# Patient Record
Sex: Male | Born: 1950 | Race: Black or African American | Hispanic: No | Marital: Married | Smoking: Former smoker
Health system: Southern US, Community
[De-identification: ages and names within clinical notes are randomized; demographics above are authoritative.]

## PROBLEM LIST (undated history)

## (undated) DIAGNOSIS — I2510 Atherosclerotic heart disease of native coronary artery without angina pectoris: Secondary | ICD-10-CM

## (undated) DIAGNOSIS — E7800 Pure hypercholesterolemia, unspecified: Secondary | ICD-10-CM

## (undated) DIAGNOSIS — I779 Disorder of arteries and arterioles, unspecified: Secondary | ICD-10-CM

## (undated) DIAGNOSIS — E039 Hypothyroidism, unspecified: Secondary | ICD-10-CM

## (undated) DIAGNOSIS — I10 Essential (primary) hypertension: Secondary | ICD-10-CM

## (undated) DIAGNOSIS — N289 Disorder of kidney and ureter, unspecified: Secondary | ICD-10-CM

## (undated) DIAGNOSIS — R7303 Prediabetes: Secondary | ICD-10-CM

## (undated) DIAGNOSIS — G4730 Sleep apnea, unspecified: Secondary | ICD-10-CM

## (undated) DIAGNOSIS — I499 Cardiac arrhythmia, unspecified: Secondary | ICD-10-CM

## (undated) HISTORY — PX: KNEE SURGERY: SHX244

## (undated) HISTORY — PX: CORONARY ARTERY BYPASS GRAFT: SHX141

## (undated) HISTORY — PX: CARDIAC CATHETERIZATION: SHX172

---

## 1999-04-07 ENCOUNTER — Emergency Department (HOSPITAL_COMMUNITY): Admission: EM | Admit: 1999-04-07 | Discharge: 1999-04-07 | Payer: Self-pay | Admitting: Emergency Medicine

## 1999-09-28 ENCOUNTER — Emergency Department (HOSPITAL_COMMUNITY): Admission: EM | Admit: 1999-09-28 | Discharge: 1999-09-28 | Payer: Self-pay | Admitting: Emergency Medicine

## 2003-12-21 ENCOUNTER — Emergency Department (HOSPITAL_COMMUNITY): Admission: EM | Admit: 2003-12-21 | Discharge: 2003-12-21 | Payer: Self-pay | Admitting: Emergency Medicine

## 2005-07-07 ENCOUNTER — Emergency Department (HOSPITAL_COMMUNITY): Admission: EM | Admit: 2005-07-07 | Discharge: 2005-07-07 | Payer: Self-pay | Admitting: Emergency Medicine

## 2012-05-20 ENCOUNTER — Inpatient Hospital Stay (HOSPITAL_BASED_OUTPATIENT_CLINIC_OR_DEPARTMENT_OTHER)
Admission: EM | Admit: 2012-05-20 | Discharge: 2012-05-21 | DRG: 134 | Disposition: A | Discharge status: Home / Self Care | Payer: Federal, State, Local not specified - PPO | Attending: Internal Medicine | Admitting: Internal Medicine

## 2012-05-20 ENCOUNTER — Emergency Department (HOSPITAL_BASED_OUTPATIENT_CLINIC_OR_DEPARTMENT_OTHER): Payer: Federal, State, Local not specified - PPO

## 2012-05-20 ENCOUNTER — Encounter (HOSPITAL_BASED_OUTPATIENT_CLINIC_OR_DEPARTMENT_OTHER): Payer: Self-pay

## 2012-05-20 DIAGNOSIS — I10 Essential (primary) hypertension: Principal | ICD-10-CM | POA: Diagnosis present

## 2012-05-20 DIAGNOSIS — Z7982 Long term (current) use of aspirin: Secondary | ICD-10-CM

## 2012-05-20 DIAGNOSIS — E785 Hyperlipidemia, unspecified: Secondary | ICD-10-CM | POA: Diagnosis present

## 2012-05-20 DIAGNOSIS — Z79899 Other long term (current) drug therapy: Secondary | ICD-10-CM

## 2012-05-20 DIAGNOSIS — R0789 Other chest pain: Secondary | ICD-10-CM | POA: Diagnosis present

## 2012-05-20 DIAGNOSIS — I200 Unstable angina: Secondary | ICD-10-CM

## 2012-05-20 DIAGNOSIS — R079 Chest pain, unspecified: Secondary | ICD-10-CM | POA: Diagnosis present

## 2012-05-20 DIAGNOSIS — F4310 Post-traumatic stress disorder, unspecified: Secondary | ICD-10-CM | POA: Diagnosis present

## 2012-05-20 DIAGNOSIS — Z8249 Family history of ischemic heart disease and other diseases of the circulatory system: Secondary | ICD-10-CM

## 2012-05-20 DIAGNOSIS — Z87891 Personal history of nicotine dependence: Secondary | ICD-10-CM

## 2012-05-20 HISTORY — DX: Pure hypercholesterolemia, unspecified: E78.00

## 2012-05-20 HISTORY — DX: Essential (primary) hypertension: I10

## 2012-05-20 LAB — COMPREHENSIVE METABOLIC PANEL
ALT: 18 U/L (ref 0–53)
AST: 26 U/L (ref 0–37)
Albumin: 4.5 g/dL (ref 3.5–5.2)
Alkaline Phosphatase: 99 U/L (ref 39–117)
BUN: 10 mg/dL (ref 6–23)
CO2: 28 mEq/L (ref 19–32)
Calcium: 10.3 mg/dL (ref 8.4–10.5)
Chloride: 102 mEq/L (ref 96–112)
Creatinine, Ser: 1 mg/dL (ref 0.50–1.35)
GFR calc Af Amer: >90 mL/min (ref >90)
GFR calc non Af Amer: 79 mL/min — ABNORMAL LOW (ref >90)
Glucose, Bld: 94 mg/dL (ref 70–99)
Potassium: 4.1 mEq/L (ref 3.5–5.1)
Sodium: 141 mEq/L (ref 135–145)
Total Bilirubin: 0.4 mg/dL (ref 0.3–1.2)
Total Protein: 7.8 g/dL (ref 6.0–8.3)

## 2012-05-20 LAB — CBC
HCT: 42.2 % (ref 39.0–52.0)
Hemoglobin: 14.6 g/dL (ref 13.0–17.0)
MCH: 32.7 pg (ref 26.0–34.0)
MCHC: 34.6 g/dL (ref 30.0–36.0)
MCV: 94.6 fL (ref 78.0–100.0)
Platelets: 185 10*3/uL (ref 150–400)
RBC: 4.46 MIL/uL (ref 4.22–5.81)
RDW: 12 % (ref 11.5–15.5)
WBC: 5.2 10*3/uL (ref 4.0–10.5)

## 2012-05-20 LAB — TROPONIN I: Troponin I: <0.3 ng/mL (ref <0.30)

## 2012-05-20 MED ORDER — ASPIRIN 81 MG PO CHEW
CHEWABLE_TABLET | ORAL | Status: AC
  Filled 2012-05-20: qty 4

## 2012-05-20 MED ORDER — NITROGLYCERIN IN D5W 200-5 MCG/ML-% IV SOLN
2.0000 ug/min | Freq: Once | INTRAVENOUS | Status: AC
  Administered 2012-05-20: 5 ug/min via INTRAVENOUS

## 2012-05-20 MED ORDER — NITROGLYCERIN 0.4 MG SL SUBL
0.4000 mg | SUBLINGUAL_TABLET | SUBLINGUAL | Status: AC | PRN
  Administered 2012-05-20 (×3): 0.4 mg via SUBLINGUAL
  Filled 2012-05-20: qty 25

## 2012-05-20 MED ORDER — ENOXAPARIN SODIUM 100 MG/ML ~~LOC~~ SOLN
1.0000 mg/kg | Freq: Once | SUBCUTANEOUS | Status: AC
  Administered 2012-05-20: 85 mg via SUBCUTANEOUS
  Filled 2012-05-20: qty 1

## 2012-05-20 MED ORDER — ASPIRIN 81 MG PO CHEW
324.0000 mg | CHEWABLE_TABLET | Freq: Once | ORAL | Status: AC
  Administered 2012-05-20: 324 mg via ORAL

## 2012-05-20 MED ORDER — NITROGLYCERIN IN D5W 200-5 MCG/ML-% IV SOLN
INTRAVENOUS | Status: AC
  Filled 2012-05-20: qty 250

## 2012-05-20 NOTE — ED Provider Notes (Signed)
History     CSN: [id number]  Arrival date & time 05/20/12  2154   First MD Initiated Contact with Patient 05/20/12 2219      Chief Complaint  Patient presents with  . Hypertension  . Chest Pain    (Consider location/radiation/quality/duration/timing/severity/associated sxs/prior treatment) HPI Complains of left-sided anterior chest pain nonradiating described as a "heaviness "onset 6:45 PM today accompanied by palpitations. Other associated symptoms include nausea and sweatiness and mild shortness of breath. Pain is presently mild and nonradiating. Not made better or worse by anything. No treatment prior to coming here. Patient is to noncompliance with his medications recently Past Medical History  Diagnosis Date  . Hypertension   . High cholesterol     Past Surgical History  Procedure Date  . Knee surgery     No family history on file.  History  Substance Use Topics  . Smoking status: Former Games developer  . Smokeless tobacco: Not on file  . Alcohol Use: Yes      Review of Systems  Constitutional: Negative.   HENT: Negative.   Respiratory: Positive for shortness of breath.   Cardiovascular: Positive for chest pain and palpitations.       Sweatiness  Gastrointestinal: Positive for nausea.  Musculoskeletal: Negative.   Skin: Negative.   Neurological: Negative.   Hematological: Negative.   Psychiatric/Behavioral: Negative.   All other systems reviewed and are negative.    Allergies  Review of patient's allergies indicates no known allergies.  Home Medications   Current Outpatient Rx  Name Route Sig Dispense Refill  . CHLORTHALIDONE 25 MG PO TABS Oral Take 25 mg by mouth daily.    Marland Kitchen FLUOXETINE HCL 20 MG PO CAPS Oral Take 20 mg by mouth daily.    Marland Kitchen PROPRANOLOL HCL 80 MG PO TABS Oral Take 80 mg by mouth 3 (three) times daily.      BP 217/105  Pulse 61  Temp(Src) 98.3 F (36.8 C) (Oral)  Resp 18  Ht 5\' 10"  (1.778 m)  Wt 189 lb (85.73 kg)  BMI 27.12  kg/m2  Physical Exam  Nursing note and vitals reviewed. Constitutional: He appears well-developed and well-nourished.  HENT:  Head: Normocephalic and atraumatic.  Eyes: Conjunctivae are normal. Pupils are equal, round, and reactive to light.  Neck: Neck supple. No tracheal deviation present. No thyromegaly present.  Cardiovascular: Normal rate and regular rhythm.   No murmur heard. Pulmonary/Chest: Effort normal and breath sounds normal.  Abdominal: Soft. Bowel sounds are normal. He exhibits no distension. There is no tenderness.  Musculoskeletal: Normal range of motion. He exhibits no edema and no tenderness.  Neurological: He is alert. Coordination normal.  Skin: Skin is warm and dry. No rash noted.  Psychiatric: He has a normal mood and affect.    ED Course  Procedures (including critical care time) Patient's pain improved after treatment with 3 sublingual nitroglycerin and aspirin by mouth. At 20 3:25 PM he was not pain-free therefore I ordered intravenous nitroglycerin drip and Lovenox subcutaneous. At 12:15 AM patient is asymptomatic pain free  Labs Reviewed  CBC  TROPONIN I  COMPREHENSIVE METABOLIC PANEL   Date: 05/20/2012  Rate: 70  Rhythm: normal sinus rhythm  QRS Axis: left  Intervals: normal  ST/T Wave abnormalities: normal  Conduction Disutrbances:none  Narrative Interpretation:   Old EKG Reviewed: none available  No results found.   No diagnosis found.    MDM  Spoke with Dr. Nanetta Batty from cardiology; suggest admit to hospitalist service  and consultation with cardiology Spoke with [facility] Plan admit telemetry. Diagnosis in need for hospitalization discuss with family members Diagnosis #1 unstable angina Diagnosis #2 hypertension Diagnosis #3 medication noncompliance  CRITICAL CARE Performed by: Doug Sou   Total critical care time: 30 minute  Critical care time was exclusive of separately billable procedures and treating  other patients.  Critical care was necessary to treat or prevent imminent or life-threatening deterioration.  Critical care was time spent personally by me on the following activities: development of treatment plan with patient and/or surrogate as well as nursing, discussions with consultants, evaluation of patient's response to treatment, examination of patient, obtaining history from patient or surrogate, ordering and performing treatments and interventions, ordering and review of laboratory studies, ordering and review of radiographic studies, pulse oximetry and re-evaluation of patient's condition.      Doug Sou, MD 05/21/12 [phone]

## 2012-05-20 NOTE — ED Notes (Signed)
C/o elevated BP with CP "chest heaviness" started approx 1pm-BP 214/103 at home

## 2012-05-20 NOTE — ED Notes (Signed)
Nitro gtt started MD aware of HR 58

## 2012-05-21 ENCOUNTER — Encounter (HOSPITAL_COMMUNITY): Payer: Self-pay | Admitting: Family Medicine

## 2012-05-21 DIAGNOSIS — F4310 Post-traumatic stress disorder, unspecified: Secondary | ICD-10-CM | POA: Diagnosis present

## 2012-05-21 DIAGNOSIS — Z8249 Family history of ischemic heart disease and other diseases of the circulatory system: Secondary | ICD-10-CM

## 2012-05-21 DIAGNOSIS — E0591 Thyrotoxicosis, unspecified with thyrotoxic crisis or storm: Secondary | ICD-10-CM

## 2012-05-21 DIAGNOSIS — E785 Hyperlipidemia, unspecified: Secondary | ICD-10-CM | POA: Diagnosis present

## 2012-05-21 DIAGNOSIS — I10 Essential (primary) hypertension: Secondary | ICD-10-CM | POA: Diagnosis present

## 2012-05-21 DIAGNOSIS — R079 Chest pain, unspecified: Secondary | ICD-10-CM

## 2012-05-21 DIAGNOSIS — I379 Nonrheumatic pulmonary valve disorder, unspecified: Secondary | ICD-10-CM

## 2012-05-21 LAB — TROPONIN I
Troponin I: <0.3 ng/mL (ref <0.30)
Troponin I: <0.3 ng/mL (ref <0.30)

## 2012-05-21 LAB — BASIC METABOLIC PANEL
CO2: 28 mEq/L (ref 19–32)
Chloride: 101 mEq/L (ref 96–112)
Glucose, Bld: 101 mg/dL — ABNORMAL HIGH (ref 70–99)
Sodium: 137 mEq/L (ref 135–145)

## 2012-05-21 LAB — LIPID PANEL
Cholesterol: 194 mg/dL (ref 0–200)
HDL: 49 mg/dL (ref >39)
Triglycerides: 84 mg/dL (ref <150)

## 2012-05-21 LAB — CBC
Hemoglobin: 13.3 g/dL (ref 13.0–17.0)
Platelets: 172 10*3/uL (ref 150–400)
RBC: 4.12 MIL/uL — ABNORMAL LOW (ref 4.22–5.81)
WBC: 5.8 10*3/uL (ref 4.0–10.5)

## 2012-05-21 MED ORDER — HYDROCODONE-ACETAMINOPHEN 5-325 MG PO TABS
2.0000 | ORAL_TABLET | Freq: Once | ORAL | Status: AC
  Administered 2012-05-21: 2 via ORAL
  Filled 2012-05-21: qty 2

## 2012-05-21 MED ORDER — NITROGLYCERIN 0.3 MG SL SUBL: 0.3000 mg | SUBLINGUAL_TABLET | SUBLINGUAL | Status: DC | PRN

## 2012-05-21 MED ORDER — METOPROLOL TARTRATE 25 MG PO TABS
25.0000 mg | ORAL_TABLET | Freq: Two times a day (BID) | ORAL | Status: DC
Start: 2012-05-21 — End: 2012-05-21
  Filled 2012-05-21 (×2): qty 1

## 2012-05-21 MED ORDER — CHLORTHALIDONE 25 MG PO TABS
25.0000 mg | ORAL_TABLET | Freq: Every day | ORAL | Status: DC
Start: 2012-05-21 — End: 2012-05-21
  Administered 2012-05-21: 25 mg via ORAL
  Filled 2012-05-21: qty 1

## 2012-05-21 MED ORDER — LISINOPRIL 10 MG PO TABS
10.0000 mg | ORAL_TABLET | Freq: Every day | ORAL | Status: DC
  Filled 2012-05-21: qty 1

## 2012-05-21 MED ORDER — PROPRANOLOL HCL 80 MG PO TABS
80.0000 mg | ORAL_TABLET | Freq: Three times a day (TID) | ORAL | Status: DC
Start: 2012-05-21 — End: 2012-05-21
  Administered 2012-05-21 (×3): 80 mg via ORAL
  Filled 2012-05-21 (×4): qty 1

## 2012-05-21 MED ORDER — ASPIRIN EC 81 MG PO TBEC
81.0000 mg | DELAYED_RELEASE_TABLET | Freq: Every day | ORAL | Status: DC
  Administered 2012-05-21: 81 mg via ORAL
  Filled 2012-05-21: qty 1

## 2012-05-21 MED ORDER — ATORVASTATIN CALCIUM 20 MG PO TABS: 20.0000 mg | ORAL_TABLET | Freq: Every day | ORAL | Status: DC

## 2012-05-21 MED ORDER — ATORVASTATIN CALCIUM 20 MG PO TABS
20.0000 mg | ORAL_TABLET | Freq: Every day | ORAL | Status: DC
Start: 2012-05-21 — End: 2012-05-21
  Filled 2012-05-21: qty 1

## 2012-05-21 MED ORDER — ZOLPIDEM TARTRATE 5 MG PO TABS: 5.0000 mg | ORAL_TABLET | Freq: Every evening | ORAL | Status: DC | PRN

## 2012-05-21 MED ORDER — ONDANSETRON HCL 4 MG/2ML IJ SOLN: 4.0000 mg | Freq: Four times a day (QID) | INTRAMUSCULAR | Status: DC | PRN

## 2012-05-21 MED ORDER — SODIUM CHLORIDE 0.9 % IJ SOLN: 3.0000 mL | INTRAMUSCULAR | Status: DC | PRN

## 2012-05-21 MED ORDER — NITROGLYCERIN 0.4 MG SL SUBL: 0.4000 mg | SUBLINGUAL_TABLET | SUBLINGUAL | Status: AC | PRN

## 2012-05-21 MED ORDER — ASPIRIN 81 MG PO TBEC: 81.0000 mg | DELAYED_RELEASE_TABLET | Freq: Every day | ORAL | Status: AC

## 2012-05-21 MED ORDER — FLUOXETINE HCL 20 MG PO CAPS
20.0000 mg | ORAL_CAPSULE | Freq: Every day | ORAL | Status: DC
  Administered 2012-05-21: 20 mg via ORAL
  Filled 2012-05-21: qty 1

## 2012-05-21 MED ORDER — ACETAMINOPHEN 325 MG PO TABS: 650.0000 mg | ORAL_TABLET | Freq: Four times a day (QID) | ORAL | Status: DC | PRN

## 2012-05-21 MED ORDER — PROPRANOLOL HCL 80 MG PO TABS: 80.0000 mg | ORAL_TABLET | Freq: Three times a day (TID) | ORAL | Status: DC

## 2012-05-21 MED ORDER — NITROGLYCERIN 2 % TD OINT
0.5000 [in_us] | TOPICAL_OINTMENT | Freq: Four times a day (QID) | TRANSDERMAL | Status: DC
  Administered 2012-05-21 (×2): 0.5 [in_us] via TOPICAL
  Filled 2012-05-21: qty 30

## 2012-05-21 MED ORDER — SODIUM CHLORIDE 0.9 % IJ SOLN
3.0000 mL | Freq: Two times a day (BID) | INTRAMUSCULAR | Status: DC
  Administered 2012-05-21: 3 mL via INTRAVENOUS

## 2012-05-21 MED ORDER — NITROGLYCERIN 0.4 MG SL SUBL: 0.4000 mg | SUBLINGUAL_TABLET | SUBLINGUAL | Status: DC | PRN

## 2012-05-21 MED ORDER — PROPRANOLOL HCL 80 MG PO TABS: 80.0000 mg | ORAL_TABLET | Freq: Two times a day (BID) | ORAL | Status: DC

## 2012-05-21 MED ORDER — ALUM & MAG HYDROXIDE-SIMETH 200-200-20 MG/5ML PO SUSP: 30.0000 mL | Freq: Four times a day (QID) | ORAL | Status: DC | PRN

## 2012-05-21 MED ORDER — HYDROCODONE-ACETAMINOPHEN 5-325 MG PO TABS: 1.0000 | ORAL_TABLET | ORAL | Status: DC | PRN

## 2012-05-21 MED ORDER — ONDANSETRON HCL 4 MG PO TABS: 4.0000 mg | ORAL_TABLET | Freq: Four times a day (QID) | ORAL | Status: DC | PRN

## 2012-05-21 MED ORDER — ACETAMINOPHEN 650 MG RE SUPP: 650.0000 mg | Freq: Four times a day (QID) | RECTAL | Status: DC | PRN

## 2012-05-21 MED ORDER — CHLORTHALIDONE 25 MG PO TABS: 25.0000 mg | ORAL_TABLET | Freq: Every day | ORAL | Status: DC

## 2012-05-21 MED ORDER — ENOXAPARIN SODIUM 40 MG/0.4ML ~~LOC~~ SOLN
40.0000 mg | Freq: Every day | SUBCUTANEOUS | Status: DC
  Filled 2012-05-21: qty 0.4

## 2012-05-21 MED ORDER — SODIUM CHLORIDE 0.9 % IV SOLN: INTRAVENOUS | Status: DC

## 2012-05-21 MED ORDER — ASPIRIN EC 325 MG PO TBEC
325.0000 mg | DELAYED_RELEASE_TABLET | Freq: Every day | ORAL | Status: DC
  Filled 2012-05-21: qty 1

## 2012-05-21 NOTE — Consult Note (Signed)
Reason for Consult: Chest pain  Requesting Physician: Family practice  HPI: This is a 61 y.o. male with a past medical history significant for remote cath at [facility] showing no significant CAD per pt. He is admitted now with SSCP "pressure" in the setting of uncontrolled HTN (214/ 104). The pt admits he ran out of Chlorthalidone 3-4 days ago. He does exercise on a regular basis with chest pain. His Troponin is negative X2 and his EKG is normal. He has had no further pain since admission.  PMHx:  Past Medical History  Diagnosis Date  . Hypertension   . High cholesterol    Past Surgical History  Procedure Date  . Knee surgery     FAMHx: Family History  Problem Relation Age of Onset  . Coronary artery disease Father 46    died of an MI    SOCHx:  reports that he quit smoking about 20 years ago. He does not have any smokeless tobacco history on file. He reports that he drinks alcohol. He reports that he does not use illicit drugs.  ALLERGIES: No Known Allergies  ROS: Pertinent items are noted in HPI. No history of stroke. No history of palpitations.  HOME MEDICATIONS: Prescriptions prior to admission  Medication Sig Dispense Refill  . Ergocalciferol (VITAMIN D2 PO) Take 1.25 mg by mouth.      Marland Kitchen FLUoxetine (PROZAC) 20 MG capsule Take 20 mg by mouth daily.      Marland Kitchen HYDROcodone-acetaminophen (LORCET) 10-650 MG per tablet Take 1 tablet by mouth every 6 (six) hours as needed.      Marland Kitchen DISCONTD: chlorthalidone (HYGROTON) 25 MG tablet Take 25 mg by mouth daily.      Marland Kitchen DISCONTD: propranolol (INDERAL) 80 MG tablet Take 80 mg by mouth 3 (three) times daily.        HOSPITAL MEDICATIONS: I have reviewed the patient's current medications.  VITALS: Blood pressure 134/72, pulse 59, temperature 98.2 F (36.8 C), temperature source Oral, resp. rate 16, height 5\' 10"  (1.778 m), weight 90.9 kg (200 lb 6.4 oz), SpO2 97.00%.  PHYSICAL EXAM: General appearance: alert, cooperative, no distress  and flat affect Neck: no carotid bruit, no JVD, supple, symmetrical, trachea midline and thyroid not enlarged, symmetric, no tenderness/mass/nodules Lungs: clear to auscultation bilaterally Heart: regular rate and rhythm Abdomen: soft, non-tender; bowel sounds normal; no masses,  no organomegaly Extremities: extremities normal, atraumatic, no cyanosis or edema Pulses: 2+ and symmetric Skin: Skin color, texture, turgor normal. No rashes or lesions Neurologic: Grossly normal  LABS: Results for orders placed during the hospital encounter of 05/20/12 (from the past 48 hour(s))  TROPONIN I     Status: Normal   Collection Time   05/20/12 10:13 PM      Component Value Range Comment   Troponin I <0.30  <0.30 (ng/mL)   CBC     Status: Normal   Collection Time   05/20/12 10:13 PM      Component Value Range Comment   WBC 5.2  4.0 - 10.5 (K/uL)    RBC 4.46  4.22 - 5.81 (MIL/uL)    Hemoglobin 14.6  13.0 - 17.0 (g/dL)    HCT 95.6  21.3 - 08.6 (%)    MCV 94.6  78.0 - 100.0 (fL)    MCH 32.7  26.0 - 34.0 (pg)    MCHC 34.6  30.0 - 36.0 (g/dL)    RDW 57.8  46.9 - 62.9 (%)    Platelets 185  150 - 400 (K/uL)  COMPREHENSIVE METABOLIC PANEL     Status: Abnormal   Collection Time   05/20/12 10:13 PM      Component Value Range Comment   Sodium 141  135 - 145 (mEq/L)    Potassium 4.1  3.5 - 5.1 (mEq/L)    Chloride 102  96 - 112 (mEq/L)    CO2 28  19 - 32 (mEq/L)    Glucose, Bld 94  70 - 99 (mg/dL)    BUN 10  6 - 23 (mg/dL)    Creatinine, Ser 1.61  0.50 - 1.35 (mg/dL)    Calcium 09.6  8.4 - 10.5 (mg/dL)    Total Protein 7.8  6.0 - 8.3 (g/dL)    Albumin 4.5  3.5 - 5.2 (g/dL)    AST 26  0 - 37 (U/L)    ALT 18  0 - 53 (U/L)    Alkaline Phosphatase 99  39 - 117 (U/L)    Total Bilirubin 0.4  0.3 - 1.2 (mg/dL)    GFR calc non Af Amer 79 (*) >90 (mL/min)    GFR calc Af Amer >90  >90 (mL/min)   CBC     Status: Abnormal   Collection Time   05/21/12  4:26 AM      Component Value Range Comment   WBC 5.8   4.0 - 10.5 (K/uL)    RBC 4.12 (*) 4.22 - 5.81 (MIL/uL)    Hemoglobin 13.3  13.0 - 17.0 (g/dL)    HCT 04.5  40.9 - 81.1 (%)    MCV 95.1  78.0 - 100.0 (fL)    MCH 32.3  26.0 - 34.0 (pg)    MCHC 33.9  30.0 - 36.0 (g/dL)    RDW 91.4  78.2 - 95.6 (%)    Platelets 172  150 - 400 (K/uL)   BASIC METABOLIC PANEL     Status: Abnormal   Collection Time   05/21/12  4:26 AM      Component Value Range Comment   Sodium 137  135 - 145 (mEq/L)    Potassium 3.8  3.5 - 5.1 (mEq/L)    Chloride 101  96 - 112 (mEq/L)    CO2 28  19 - 32 (mEq/L)    Glucose, Bld 101 (*) 70 - 99 (mg/dL)    BUN 9  6 - 23 (mg/dL)    Creatinine, Ser 2.13  0.50 - 1.35 (mg/dL)    Calcium 9.6  8.4 - 10.5 (mg/dL)    GFR calc non Af Amer 87 (*) >90 (mL/min)    GFR calc Af Amer >90  >90 (mL/min)   TROPONIN I     Status: Normal   Collection Time   05/21/12  4:26 AM      Component Value Range Comment   Troponin I <0.30  <0.30 (ng/mL)   LIPID PANEL     Status: Abnormal   Collection Time   05/21/12  4:26 AM      Component Value Range Comment   Cholesterol 194  0 - 200 (mg/dL)    Triglycerides 84  <086 (mg/dL)    HDL 49  >57 (mg/dL)    Total CHOL/HDL Ratio 4.0      VLDL 17  0 - 40 (mg/dL)    LDL Cholesterol 846 (*) 0 - 99 (mg/dL)     IMAGING: Dg Chest 2 View  05/20/2012  *RADIOLOGY REPORT*  Clinical Data: Hypertension.  Chest pain.  CHEST - 2 VIEW  Comparison: None.  Findings: Suboptimal inspiration  accounts for crowded bronchovascular markings, especially in the lung bases, and accentuates the cardiac silhouette.  Taking this into account, cardiac silhouette normal in size.  Thoracic aorta tortuous.  Hilar and mediastinal contours otherwise unremarkable.  Lungs clear.  No pleural effusions.  Degenerative changes involving the thoracic spine.  IMPRESSION: Suboptimal inspiration.  No acute cardiopulmonary disease.  Original Report Authenticated By: Arnell Sieving, M.D.    IMPRESSION: Principal Problem:  *HTN (hypertension),  malignant Active Problems:  Chest pain  PTSD (post-traumatic stress disorder)  Hyperlipidemia  Family history of coronary artery disease   RECOMMENDATION: MD to see. Medications resumed. 2D pending. He could have an OP Myoview.  Time Spent Directly with Patient: 40 minutes  Rhaelyn Giron K 05/21/2012, 10:09 AM

## 2012-05-21 NOTE — Discharge Summary (Addendum)
Physician Discharge Summary  Patient ID: JEBADIAH IMPERATO MRN: [id number] DOB/AGE: 03-18-1951 61 y.o.  Admit date: 05/20/2012 Discharge date: 05/21/2012  Primary Care Physician:  No primary provider on file.  Discharge Diagnoses:    .HTN (hypertension), malignant (patient ran out of his antihypertensives) Atypical chest pain resolved History of PTSD Hyperlipidemia  Consults:  Cardiology   Discharge Medications: Medication List  As of 05/21/2012  9:31 AM   TAKE these medications         aspirin 81 MG EC tablet   Take 1 tablet (81 mg total) by mouth daily.      atorvastatin 20 MG tablet   Commonly known as: LIPITOR   Take 1 tablet (20 mg total) by mouth daily.      chlorthalidone 25 MG tablet   Commonly known as: HYGROTON   Take 1 tablet (25 mg total) by mouth daily.      FLUoxetine 20 MG capsule   Commonly known as: PROZAC   Take 20 mg by mouth daily.      HYDROcodone-acetaminophen 10-650 MG per tablet   Commonly known as: LORCET   Take 1 tablet by mouth every 6 (six) hours as needed.      nitroGLYCERIN 0.4 MG SL tablet   Commonly known as: NITROSTAT   Place 1 tablet (0.4 mg total) under the tongue every 5 (five) minutes x 3 doses as needed for chest pain.      propranolol 80 MG tablet   Commonly known as: INDERAL   Take 1 tablet (80 mg total) by mouth 3 (three) times daily.      VITAMIN D2 PO   Take 1.25 mg by mouth.             Brief H and P: For complete details please refer to admission H and P, but in brief patient is a 61 year old male who presented with chest pain. He noticed his blood pressure was also very elevated 214/105. He described the chest pain as constant with no radiation but with associated diaphoresis, lightheadedness and palpitations. In ED his blood pressure was again elevated to 226/106. Patient was given aspirin and started on nitro drip. Patient was transferred to [facility]. He did admit to being out of his medications for the  last 3-4 days.  Hospital Course:  Principal Problem:  *HTN (hypertension), malignant likely due to running out of his antihypertensives. Patient was admitted to telemetry floor. He was initially started on Nitro drip which was discontinued with improvement in his chest pain and hypertension. Patient was placed back on his home antihypertensives and his blood pressure remained very stable during the hospitalization. He had no more chest pain, 3 sets of cardiac enzymes were negative, patient underwent 2-D echocardiogram which showed EF of 65-70%. Patient was also started on aspirin 81 mg daily, LDL was noted to be 128. Patient was placed on Lipitor and counseled strongly to be compliant with his medications.Patient was also advised to set up outpatient stress test through his primary care physician in [facility].   Chest pain: Resolved, cardiac enzymes negative, 2-D echocardiogram with EF of 65-70%.   PTSD (post-traumatic stress disorder)Stable   HyperlipidemiaLipid panel was checked and revealed LDL of 128, patient was started on Lipitor   Day of Discharge BP 134/72  Pulse 59  Temp(Src) 98.2 F (36.8 C) (Oral)  Resp 16  Ht 5\' 10"  (1.778 m)  Wt 90.9 kg (200 lb 6.4 oz)  BMI 28.75 kg/m2  SpO2 97%  Physical Exam: General: Alert and awake oriented x3 not in any acute distress. HEENT: anicteric sclera, pupils reactive to light and accommodation CVS: S1-S2 clear no murmur rubs or gallops Chest: clear to auscultation bilaterally, no wheezing rales or rhonchi Abdomen: soft nontender, nondistended, normal bowel sounds, no organomegaly Extremities: no cyanosis, clubbing or edema noted bilaterally Neuro: Cranial nerves II-XII intact, no focal neurological deficits   The results of significant diagnostics from this hospitalization (including imaging, microbiology, ancillary and laboratory) are listed below for reference.    LAB RESULTS: Basic Metabolic Panel:  Lab 05/21/12  0426 05/20/12 2213  NA 137 141  K 3.8 4.1  CL 101 102  CO2 28 28  GLUCOSE 101* 94  BUN 9 10  CREATININE 0.97 1.00  CALCIUM 9.6 10.3  MG -- --  PHOS -- --   Liver Function Tests:  Lab 05/20/12 2213  AST 26  ALT 18  ALKPHOS 99  BILITOT 0.4  PROT 7.8  ALBUMIN 4.5   CBC:  Lab 05/21/12 0426 05/20/12 2213  WBC 5.8 5.2  NEUTROABS -- --  HGB 13.3 14.6  HCT 39.2 42.2  MCV 95.1 --  PLT 172 185   Cardiac Enzymes:  Lab 05/21/12 0426 05/20/12 2213  CKTOTAL -- --  CKMB -- --  CKMBINDEX -- --  TROPONINI <0.30 <0.30    Significant Diagnostic Studies:  No results found.   Disposition and Follow-up: Discharge Orders    Future Orders Please Complete By Expires   Diet - low sodium heart healthy      Increase activity slowly          DISPOSITION: Home  DIET: Heart healthy diet  ACTIVITY: As tolerated   DISCHARGE FOLLOW-UP Follow-up Information    Follow up with medical PCP in [facility] . Schedule an appointment as soon as possible for a visit in 10 days. (for hospital follow-up).          Time spent on Discharge: 45 minutes  Signed:  Kaylei Frink M.D. [facility] 05/21/2012, 9:31 AM    Addendum:  Propranolol is 80mg  PO BID at DC   [facility] M.D. [facility] 05/21/2012, 3:58 PM  Pager: [phone]

## 2012-05-21 NOTE — ED Notes (Signed)
Report given to [facility] RN [address] hall

## 2012-05-21 NOTE — Care Management Note (Signed)
    Page 1 of 1   05/21/2012     10:41:21 AM   CARE MANAGEMENT NOTE 05/21/2012  Patient:  Joe Walls, Joe Walls   Account Number:  [id number]  Date Initiated:  05/21/2012  Documentation initiated by:  SIMMONS,Ahaana Rochette  Subjective/Objective Assessment:   LIVES AT HOME WITH WIFE; IPTA; RAN OUT OF BP MEDS X 3-4 DAYS; HE STATED HE CALLED IN [facility]LL BUT NEEDED AN OFFICE VISIT FIRST. RETRIEVES HIS SCRIPTS FROM [facility] ON [address] IN [place] WITHOUT DIFFICULTY.     Action/Plan:   DISCUSSED D/C PLANNING WITH PT AND WIFE AT BEDSIDE;  2 D [facility] SCHEDULED AND IF NEG WILL D/C HOME TODAY.  NO NEEDS IDENTIFIED.   Anticipated DC Date:  05/21/2012   Anticipated DC Plan:  HOME/SELF CARE         Choice offered to / List presented to:             Status of service:  Completed, signed off Medicare Important Message given?   (If response is "NO", the following Medicare IM given date fields will be blank) Date Medicare IM given:   Date Additional Medicare IM given:    Discharge Disposition:  HOME/SELF CARE  Per UR Regulation:  Reviewed for med. necessity/level of care/duration of stay  If discussed at Long Length of Stay Meetings, dates discussed:    Comments:  05/21/12  1038  Chai Verdejo SIMMONS RN, BSN [phone]

## 2012-05-21 NOTE — Consult Note (Signed)
Pt. Seen and examined. Agree with the NP/PA-C note as written.  He was being discharged when I went to evaluate him. We discussed his chest tightness which seems to be related to hypertensive urgency as he ran out of medication. He reports having a stress test 7 months ago which was arranged through [facility].  Troponins were negative x 2.  I agree he is safe for discharge and could follow-up in our office in 1-2 weeks. Will try to request records of last stress test from [facility] medical.   Chrystie Nose, MD, [facility] Attending Cardiologist [facility]

## 2012-05-21 NOTE — Progress Notes (Signed)
Pt discharge instructions and patient education complete. IV site d/c. Site WNL. No further questions. Prescriptions with wife. D/C home with wife. Dion Saucier

## 2012-05-21 NOTE — ED Notes (Signed)
[facility] arrived for transport. No change in pt condition. 

## 2012-05-21 NOTE — Progress Notes (Signed)
UR Completed. [phone] Simmons, Joe Walls  

## 2012-05-21 NOTE — ED Notes (Signed)
Report given to [facility] from [place]. approx away. Pt requesting pain meds for his herniated disc. Will notify EDP.

## 2012-05-21 NOTE — H&P (Signed)
PCP Dr Luther Hearing   Chief Complaint:  Chest pressure  HPI: This is a 61 year old gentleman who states to get the proper to 1 PM he developed chest pressure. He checked his blood pressure it was in 214 /105. The patient described this pain essentially with no radiation. Constant. He had associated diaphoresis, weakness, lightheadedness and palpitations. He denies any nausea, vomiting, headache or localized weakness. He denies any GERD. He states the pressure lasted onto he came to the ER after midnight and medications given. In the ER patient's blood pressure was again elevated to 226/106. He was given aspirin, and started on nitro drip. Patient wasn't [place] [facility] a request was made to be transferred to [facility]. The patient's does admit to being out of one of his medication for the past 3-4 days and chlorthalidone. He states he's been taking his propranolol. However, when asked how frequently takes he takes propanolol patient answers once daily. This is a 3 times daily medication. History provided the patient is alert and oriented.  Review of Systems: Positives bolded    anorexia, fever, weight loss,, vision loss, decreased hearing, hoarseness, chest pressure, syncope, dyspnea on exertion, peripheral edema, balance deficits, hemoptysis, abdominal pain, melena, hematochezia, severe indigestion/heartburn, hematuria, incontinence, genital sores, muscle weakness, suspicious skin lesions, transient blindness, difficulty walking, depression, unusual weight change, abnormal bleeding, enlarged lymph nodes, angioedema, and breast masses.  Past Medical History: Past Medical History  Diagnosis Date  . Hypertension   . High cholesterol    Past Surgical History  Procedure Date  . Knee surgery     Medications: Prior to Admission medications   Medication Sig Start Date End Date Taking? Authorizing Provider  chlorthalidone (HYGROTON) 25 MG tablet Take 25 mg by mouth daily.   Yes Historical  Provider, MD  Ergocalciferol (VITAMIN D2 PO) Take 1.25 mg by mouth.   Yes Historical Provider, MD  FLUoxetine (PROZAC) 20 MG capsule Take 20 mg by mouth daily.   Yes Historical Provider, MD  HYDROcodone-acetaminophen (LORCET) 10-650 MG per tablet Take 1 tablet by mouth every 6 (six) hours as needed.   Yes Historical Provider, MD  propranolol (INDERAL) 80 MG tablet Take 80 mg by mouth 3 (three) times daily.   Yes Historical Provider, MD    Allergies:  No Known Allergies  Social History:  reports that he has quit smoking. He does not have any smokeless tobacco history on file. He reports that he drinks alcohol. He reports that he does not use illicit drugs.  Family History: No family history on file.  Physical Exam: Filed Vitals:   05/20/12 2314 05/21/12 0028 05/21/12 0138 05/21/12 0304  BP: 171/95 178/84 165/90 143/85  Pulse: 58 58 62 58  Temp:  98.6 F (37 C)  99 F (37.2 C)  TempSrc:  Oral  Oral  Resp:  17 16 18   Height:    5\' 10"  (1.778 m)  Weight:    90.9 kg (200 lb 6.4 oz)  SpO2:  100% 100% 95%    General:  Alert and oriented times three, well developed and nourished, no acute distress Eyes: PERRLA, pink conjunctiva, no scleral icterus ENT: Moist oral mucosa, neck supple, no thyromegaly Lungs: clear to ascultation, no wheeze, no crackles, no use of accessory muscles Cardiovascular: regular rate and rhythm, no regurgitation, no gallops, no murmurs. No carotid bruits, no JVD Abdomen: soft, positive BS, non-tender, non-distended, no organomegaly, not an acute abdomen GU: not examined Neuro: CN II - XII grossly intact, sensation intact  Musculoskeletal: strength 5/5 all extremities, no clubbing, cyanosis or edema, no reproducible chest wall pain Skin: no rash, no subcutaneous crepitation, no decubitus Psych: appropriate patient   Labs on Admission:   [facility] 05/20/12 2213  NA 141  K 4.1  CL 102  CO2 28  GLUCOSE 94  BUN 10  CREATININE 1.00  CALCIUM 10.3  MG --    PHOS --    Basename 05/20/12 2213  AST 26  ALT 18  ALKPHOS 99  BILITOT 0.4  PROT 7.8  ALBUMIN 4.5   No results found for this basename: LIPASE:2,AMYLASE:2 in the last 72 hours  Basename 05/20/12 2213  WBC 5.2  NEUTROABS --  HGB 14.6  HCT 42.2  MCV 94.6  PLT 185    Basename 05/20/12 2213  CKTOTAL --  CKMB --  CKMBINDEX --  TROPONINI <0.30   No components found with this basename: POCBNP:3 No results found for this basename: DDIMER:2 in the last 72 hours No results found for this basename: HGBA1C:2 in the last 72 hours No results found for this basename: CHOL:2,HDL:2,LDLCALC:2,TRIG:2,CHOLHDL:2,LDLDIRECT:2 in the last 72 hours No results found for this basename: TSH,T4TOTAL,FREET3,T3FREE,THYROIDAB in the last 72 hours No results found for this basename: VITAMINB12:2,FOLATE:2,FERRITIN:2,TIBC:2,IRON:2,RETICCTPCT:2 in the last 72 hours  Micro Results: No results found for this or any previous visit (from the past 240 hour(s)).   Radiological Exams on Admission: Dg Chest 2 View  05/20/2012  *RADIOLOGY REPORT*  Clinical Data: Hypertension.  Chest pain.  CHEST - 2 VIEW  Comparison: None.  Findings: Suboptimal inspiration accounts for crowded bronchovascular markings, especially in the lung bases, and accentuates the cardiac silhouette.  Taking this into account, cardiac silhouette normal in size.  Thoracic aorta tortuous.  Hilar and mediastinal contours otherwise unremarkable.  Lungs clear.  No pleural effusions.  Degenerative changes involving the thoracic spine.  IMPRESSION: Suboptimal inspiration.  No acute cardiopulmonary disease.  Original Report Authenticated By: Arnell Sieving, M.D.    EKG: Normal sinus rhythm  Assessment/Plan Present on Admission:  .HTN (hypertension), malignant Chest pain Admit to telemetry Patient on IV nitroglycerin. Will start orals and attempt to wean nitro drip. Once nitro drip discontinued, will start nitro paste. Cycle cardiac enzymes  x3. Aspirin daily. Lipid panel in the a.m. Defer to a.m. team stress test. More patient education needed regarding medication. PTSD Dyslipidemia Stable resume home medications  Full code DVT prophylaxis Team 8/Dr. Domingo Madeira, Daelynn Blower 05/21/2012, 3:53 AM

## 2012-05-21 NOTE — Progress Notes (Signed)
  Echocardiogram 2D Echocardiogram has been performed.  Jorje Guild [facility]acle 05/21/2012, 2:23 PM

## 2016-03-12 HISTORY — PX: CAROTID STENT: SHX1301

## 2016-11-22 ENCOUNTER — Other Ambulatory Visit: Payer: Self-pay

## 2016-11-22 ENCOUNTER — Emergency Department (HOSPITAL_BASED_OUTPATIENT_CLINIC_OR_DEPARTMENT_OTHER): Payer: BLUE CROSS/BLUE SHIELD

## 2016-11-22 ENCOUNTER — Emergency Department (HOSPITAL_BASED_OUTPATIENT_CLINIC_OR_DEPARTMENT_OTHER)
Admission: EM | Admit: 2016-11-22 | Discharge: 2016-11-22 | Disposition: A | Discharge status: Other Acute Inpatient Hospital | Payer: BLUE CROSS/BLUE SHIELD | Attending: Emergency Medicine | Admitting: Emergency Medicine

## 2016-11-22 ENCOUNTER — Encounter (HOSPITAL_BASED_OUTPATIENT_CLINIC_OR_DEPARTMENT_OTHER): Payer: Self-pay | Admitting: Emergency Medicine

## 2016-11-22 DIAGNOSIS — Z79899 Other long term (current) drug therapy: Secondary | ICD-10-CM | POA: Diagnosis not present

## 2016-11-22 DIAGNOSIS — Z87891 Personal history of nicotine dependence: Secondary | ICD-10-CM | POA: Insufficient documentation

## 2016-11-22 DIAGNOSIS — I213 ST elevation (STEMI) myocardial infarction of unspecified site: Secondary | ICD-10-CM

## 2016-11-22 DIAGNOSIS — R0602 Shortness of breath: Secondary | ICD-10-CM | POA: Diagnosis present

## 2016-11-22 DIAGNOSIS — I10 Essential (primary) hypertension: Secondary | ICD-10-CM | POA: Insufficient documentation

## 2016-11-22 DIAGNOSIS — I2510 Atherosclerotic heart disease of native coronary artery without angina pectoris: Secondary | ICD-10-CM | POA: Insufficient documentation

## 2016-11-22 DIAGNOSIS — Z951 Presence of aortocoronary bypass graft: Secondary | ICD-10-CM | POA: Insufficient documentation

## 2016-11-22 HISTORY — DX: Atherosclerotic heart disease of native coronary artery without angina pectoris: I25.10

## 2016-11-22 HISTORY — DX: Disorder of kidney and ureter, unspecified: N28.9

## 2016-11-22 LAB — BASIC METABOLIC PANEL
ANION GAP: 6 (ref 5–15)
BUN: 24 mg/dL — ABNORMAL HIGH (ref 6–20)
CHLORIDE: 105 mmol/L (ref 101–111)
CO2: 28 mmol/L (ref 22–32)
Calcium: 9.4 mg/dL (ref 8.9–10.3)
Creatinine, Ser: 1.19 mg/dL (ref 0.61–1.24)
GFR calc Af Amer: >60 mL/min (ref >60)
GFR calc non Af Amer: >60 mL/min (ref >60)
GLUCOSE: 91 mg/dL (ref 65–99)
POTASSIUM: 3.7 mmol/L (ref 3.5–5.1)
Sodium: 139 mmol/L (ref 135–145)

## 2016-11-22 LAB — TROPONIN I: Troponin I: <0.03 ng/mL (ref <0.03)

## 2016-11-22 LAB — CBC WITH DIFFERENTIAL/PLATELET
BASOS ABS: 0.1 10*3/uL (ref 0.0–0.1)
Basophils Relative: 1 %
Eosinophils Absolute: 0.2 10*3/uL (ref 0.0–0.7)
Eosinophils Relative: 4 %
HEMATOCRIT: 39.3 % (ref 39.0–52.0)
HEMOGLOBIN: 13.4 g/dL (ref 13.0–17.0)
LYMPHS PCT: 19 %
Lymphs Abs: 0.9 10*3/uL (ref 0.7–4.0)
MCH: 32.5 pg (ref 26.0–34.0)
MCHC: 34.1 g/dL (ref 30.0–36.0)
MCV: 95.4 fL (ref 78.0–100.0)
MONO ABS: 0.5 10*3/uL (ref 0.1–1.0)
Monocytes Relative: 11 %
NEUTROS ABS: 2.9 10*3/uL (ref 1.7–7.7)
NEUTROS PCT: 65 %
Platelets: 156 10*3/uL (ref 150–400)
RBC: 4.12 MIL/uL — AB (ref 4.22–5.81)
RDW: 12.5 % (ref 11.5–15.5)
WBC: 4.5 10*3/uL (ref 4.0–10.5)

## 2016-11-22 LAB — PROTIME-INR
INR: 1.05
PROTHROMBIN TIME: 13.7 s (ref 11.4–15.2)

## 2016-11-22 MED ORDER — NITROGLYCERIN 2 % TD OINT
1.0000 [in_us] | TOPICAL_OINTMENT | Freq: Once | TRANSDERMAL | Status: AC
  Administered 2016-11-22: 1 [in_us] via TOPICAL

## 2016-11-22 MED ORDER — NITROGLYCERIN NICU 2% OINTMENT
TOPICAL_OINTMENT | Freq: Two times a day (BID) | TRANSDERMAL | Status: DC
Start: 2016-11-22 — End: 2016-11-22
  Filled 2016-11-22: qty 30

## 2016-11-22 MED ORDER — HEPARIN BOLUS VIA INFUSION
4000.0000 [IU] | Freq: Once | INTRAVENOUS | Status: AC
  Administered 2016-11-22: 4000 [IU] via INTRAVENOUS

## 2016-11-22 MED ORDER — ASPIRIN 81 MG PO CHEW
324.0000 mg | CHEWABLE_TABLET | Freq: Once | ORAL | Status: AC
  Administered 2016-11-22: 324 mg via ORAL
  Filled 2016-11-22: qty 4

## 2016-11-22 MED ORDER — NITROGLYCERIN 2 % TD OINT
TOPICAL_OINTMENT | TRANSDERMAL | Status: AC
  Filled 2016-11-22: qty 1

## 2016-11-22 MED ORDER — HEPARIN (PORCINE) IN NACL 100-0.45 UNIT/ML-% IJ SOLN
12.0000 [IU]/kg/h | INTRAMUSCULAR | Status: DC
  Administered 2016-11-22: 12 [IU]/kg/h via INTRAVENOUS
  Filled 2016-11-22: qty 250

## 2016-11-22 NOTE — ED Provider Notes (Signed)
[facility]   CSN: [id number] Arrival date & time: 11/22/16  0231     History   Chief Complaint Chief Complaint  Patient presents with  . Hypertension    HPI Joe Walls is a 65 y.o. male.  The history is provided by the patient. No language interpreter was used.  Hypertension  This is a recurrent problem. The current episode started more than 2 days ago. The problem occurs constantly. The problem has been gradually worsening. Associated symptoms include shortness of breath. Pertinent negatives include no chest pain and no abdominal pain. Associated symptoms comments: Lightheadness, neck pain (but for years). Nothing aggravates the symptoms. Nothing relieves the symptoms. He has tried nothing for the symptoms. The treatment provided no relief.  PMH significant for ASCVD with failed PTCI and CABG in 02/2016 who presents with In the past week or so has had DOE and worsening SOB, worse this evening but denies true chest pain shoulder pain and also denied n/v/d.  No signs of claudication.  No weakness or numbness.  No cough.  No edema.  Has missed Brilinta doses.     Past Medical History:  Diagnosis Date  . CAD (coronary artery disease)   . High cholesterol   . Hypertension   . Renal disorder     Patient Active Problem List   Diagnosis Date Noted  . HTN (hypertension), malignant 05/21/2012  . Chest pain 05/21/2012  . PTSD (post-traumatic stress disorder) 05/21/2012  . Hyperlipidemia 05/21/2012  . Family history of coronary artery disease 05/21/2012    Past Surgical History:  Procedure Laterality Date  . CORONARY ARTERY BYPASS GRAFT    . KNEE SURGERY         Home Medications    Prior to Admission medications   Medication Sig Start Date End Date Taking? Authorizing Provider  ticagrelor (BRILINTA) 90 MG TABS tablet Take 90 mg by mouth 2 (two) times daily.   Yes Historical Provider, MD  atorvastatin (LIPITOR) 20 MG tablet Take 1 tablet (20  mg total) by mouth daily. 05/21/12 05/21/13  Ripudeep Jenna LuoK Rai, MD  chlorthalidone (HYGROTON) 25 MG tablet Take 1 tablet (25 mg total) by mouth daily. 05/21/12   Ripudeep Jenna LuoK Rai, MD  Ergocalciferol (VITAMIN D2 PO) Take 1.25 mg by mouth.    Historical Provider, MD  FLUoxetine (PROZAC) 20 MG capsule Take 20 mg by mouth daily.    Historical Provider, MD  HYDROcodone-acetaminophen (LORCET) 10-650 MG per tablet Take 1 tablet by mouth every 6 (six) hours as needed.    Historical Provider, MD  nitroGLYCERIN (NITROSTAT) 0.4 MG SL tablet Place 1 tablet (0.4 mg total) under the tongue every 5 (five) minutes x 3 doses as needed for chest pain. 05/21/12 05/21/13  Ripudeep Jenna LuoK Rai, MD  propranolol (INDERAL) 80 MG tablet Take 1 tablet (80 mg total) by mouth 2 (two) times daily. 05/21/12   Ripudeep Jenna LuoK Rai, MD    Family History Family History  Problem Relation Age of Onset  . Coronary artery disease Father 3156    died of an MI    Social History Social History  Substance Use Topics  . Smoking status: Former Smoker    Quit date: 05/21/1992  . Smokeless tobacco: Not on file  . Alcohol use Yes     Allergies   Patient has no known allergies.   Review of Systems Review of Systems  Constitutional: Negative for chills, diaphoresis and fever.  Respiratory: Positive for shortness of breath. Negative  for cough and stridor.   Cardiovascular: Negative for chest pain and leg swelling.  Gastrointestinal: Negative for abdominal pain, nausea and vomiting.  Musculoskeletal: Positive for neck pain.  Neurological: Negative for weakness and numbness.  All other systems reviewed and are negative.    Physical Exam Updated Vital Signs BP (!) 187/105 (BP Location: Left Arm)   Pulse 72   Temp 97.9 F (36.6 C) (Oral)   Resp 20   Ht 5' 9.5" (1.765 m)   Wt 183 lb (83 kg)   SpO2 98%   BMI 26.64 kg/m   Physical Exam  Constitutional: He is oriented to person, place, and time. He appears well-developed and well-nourished.    HENT:  Head: Normocephalic and atraumatic.  Mouth/Throat: No oropharyngeal exudate.  Eyes: Conjunctivae are normal. Pupils are equal, round, and reactive to light.  Neck: Normal range of motion. Neck supple. No JVD present.  Cardiovascular: Normal rate, regular rhythm, normal heart sounds and intact distal pulses.   Pulmonary/Chest: Effort normal and breath sounds normal. No stridor. He has no wheezes.  Abdominal: Soft. Bowel sounds are normal. He exhibits no mass. There is no tenderness. There is no rebound and no guarding.  Musculoskeletal: Normal range of motion.  Neurological: He is alert and oriented to person, place, and time.  Skin: Skin is warm and dry. Capillary refill takes less than 2 seconds. He is not diaphoretic.     ED Treatments / Results   Vitals:   11/22/16 0321 11/22/16 0330  BP: 163/96 (!) 201/109  Pulse: 64 67  Resp: 15 20  Temp:     Results for orders placed or performed during the hospital encounter of 11/22/16  CBC with Differential/Platelet  Result Value Ref Range   WBC 4.5 4.0 - 10.5 K/uL   RBC 4.12 (L) 4.22 - 5.81 MIL/uL   Hemoglobin 13.4 13.0 - 17.0 g/dL   HCT 16.1 09.6 - 04.5 %   MCV 95.4 78.0 - 100.0 fL   MCH 32.5 26.0 - 34.0 pg   MCHC 34.1 30.0 - 36.0 g/dL   RDW 40.9 81.1 - 91.4 %   Platelets 156 150 - 400 K/uL   Neutrophils Relative % 65 %   Neutro Abs 2.9 1.7 - 7.7 K/uL   Lymphocytes Relative 19 %   Lymphs Abs 0.9 0.7 - 4.0 K/uL   Monocytes Relative 11 %   Monocytes Absolute 0.5 0.1 - 1.0 K/uL   Eosinophils Relative 4 %   Eosinophils Absolute 0.2 0.0 - 0.7 K/uL   Basophils Relative 1 %   Basophils Absolute 0.1 0.0 - 0.1 K/uL  Basic metabolic panel  Result Value Ref Range   Sodium 139 135 - 145 mmol/L   Potassium 3.7 3.5 - 5.1 mmol/L   Chloride 105 101 - 111 mmol/L   CO2 28 22 - 32 mmol/L   Glucose, Bld 91 65 - 99 mg/dL   BUN 24 (H) 6 - 20 mg/dL   Creatinine, Ser 7.82 0.61 - 1.24 mg/dL   Calcium 9.4 8.9 - 95.6 mg/dL   GFR calc  non Af Amer >60 >60 mL/min   GFR calc Af Amer >60 >60 mL/min   Anion gap 6 5 - 15  Protime-INR  Result Value Ref Range   Prothrombin Time 13.7 11.4 - 15.2 seconds   INR 1.05    Dg Chest 2 View  Result Date: 11/22/2016 CLINICAL DATA:  Acute onset of high blood pressure. Initial encounter. EXAM: CHEST  2 VIEW COMPARISON:  Chest radiograph performed 07/16/2016 FINDINGS: Mild left basilar airspace opacity may reflect atelectasis or possibly mild pneumonia, depending on the patient's symptoms. No pleural effusion or pneumothorax is seen. The heart is borderline normal in size. The patient is status post median sternotomy, with evidence of prior CABG. No acute osseous abnormalities are seen. IMPRESSION: Mild left basilar airspace opacity may reflect atelectasis or possibly mild pneumonia, depending on the patient's symptoms. Electronically Signed   By: Roanna RaiderJeffery  Chang M.D.   On: 11/22/2016 03:22    I doubt PNA as the patient has no fevers nor cough nor chills.     Procedures Procedures (including critical care time)  Medications  heparin ADULT infusion 100 units/mL (25000 units/21850mL sodium chloride 0.45%) (12 Units/kg/hr  83 kg Intravenous New Bag/Given 11/22/16 0329)  nitroGLYCERIN (NITROGLYN) 2 % ointment 1 inch (not administered)  aspirin chewable tablet 324 mg (324 mg Oral Given 11/22/16 0328)  heparin bolus via infusion 4,000 Units (4,000 Units Intravenous Bolus from Bag 11/22/16 0331)     ED ECG REPORT   Date: 11/22/2016  Rate: 65  Rhythm: normal sinus rhythm  QRS Axis: normal  Intervals: normal  ST/T Wave abnormalities: ST elevations inferiorly  Conduction Disutrbances:none  Narrative Interpretation:   Old EKG Reviewed: changes noted  previous EKG in our system is from 2013 and normal   I have personally reviewed the EKG tracing and agree with the computerized printout as noted.  Final Clinical Impressions(s) / ED Diagnoses  STEMI:  Appears to be inferior.  [facility].  Immediately contacted for transfer.  I do not believe this is PNA and favor atelectasis as the patient's history and exam are not consistent with PNA.     [facility]  [facility]  EMS transfer    Keni Elison, MD 11/22/16 [phone]

## 2016-11-22 NOTE — ED Notes (Signed)
[facility] Report given to paramedic and [facility]

## 2016-11-22 NOTE — ED Notes (Signed)
Patient transported to X-ray 

## 2016-11-22 NOTE — ED Triage Notes (Addendum)
Pt reports checking his BP tonight and it was elevated. Pt was recently taken off BP medication by his doctor.

## 2018-04-05 ENCOUNTER — Encounter (HOSPITAL_BASED_OUTPATIENT_CLINIC_OR_DEPARTMENT_OTHER): Payer: Self-pay

## 2018-04-05 ENCOUNTER — Other Ambulatory Visit: Payer: Self-pay

## 2018-04-05 ENCOUNTER — Emergency Department (HOSPITAL_BASED_OUTPATIENT_CLINIC_OR_DEPARTMENT_OTHER)
Admission: EM | Admit: 2018-04-05 | Discharge: 2018-04-05 | Disposition: A | Discharge status: Home / Self Care | Payer: BLUE CROSS/BLUE SHIELD | Attending: Emergency Medicine | Admitting: Emergency Medicine

## 2018-04-05 ENCOUNTER — Emergency Department (HOSPITAL_BASED_OUTPATIENT_CLINIC_OR_DEPARTMENT_OTHER): Payer: BLUE CROSS/BLUE SHIELD

## 2018-04-05 DIAGNOSIS — N12 Tubulo-interstitial nephritis, not specified as acute or chronic: Secondary | ICD-10-CM | POA: Insufficient documentation

## 2018-04-05 DIAGNOSIS — M545 Low back pain, unspecified: Secondary | ICD-10-CM

## 2018-04-05 DIAGNOSIS — I2510 Atherosclerotic heart disease of native coronary artery without angina pectoris: Secondary | ICD-10-CM | POA: Insufficient documentation

## 2018-04-05 DIAGNOSIS — Z79899 Other long term (current) drug therapy: Secondary | ICD-10-CM | POA: Insufficient documentation

## 2018-04-05 DIAGNOSIS — Z87891 Personal history of nicotine dependence: Secondary | ICD-10-CM | POA: Insufficient documentation

## 2018-04-05 DIAGNOSIS — I10 Essential (primary) hypertension: Secondary | ICD-10-CM | POA: Diagnosis not present

## 2018-04-05 DIAGNOSIS — R0989 Other specified symptoms and signs involving the circulatory and respiratory systems: Secondary | ICD-10-CM | POA: Insufficient documentation

## 2018-04-05 DIAGNOSIS — Z951 Presence of aortocoronary bypass graft: Secondary | ICD-10-CM | POA: Diagnosis not present

## 2018-04-05 LAB — CBC WITH DIFFERENTIAL/PLATELET
BASOS ABS: 0 10*3/uL (ref 0.0–0.1)
Basophils Relative: 0 %
EOS PCT: 3 %
Eosinophils Absolute: 0.2 10*3/uL (ref 0.0–0.7)
HEMATOCRIT: 39 % (ref 39.0–52.0)
Hemoglobin: 12.9 g/dL — ABNORMAL LOW (ref 13.0–17.0)
LYMPHS PCT: 14 %
Lymphs Abs: 0.8 10*3/uL (ref 0.7–4.0)
MCH: 31.7 pg (ref 26.0–34.0)
MCHC: 33.1 g/dL (ref 30.0–36.0)
MCV: 95.8 fL (ref 78.0–100.0)
Monocytes Absolute: 0.4 10*3/uL (ref 0.1–1.0)
Monocytes Relative: 7 %
NEUTROS ABS: 4.6 10*3/uL (ref 1.7–7.7)
Neutrophils Relative %: 76 %
PLATELETS: 160 10*3/uL (ref 150–400)
RBC: 4.07 MIL/uL — AB (ref 4.22–5.81)
RDW: 12 % (ref 11.5–15.5)
WBC: 6 10*3/uL (ref 4.0–10.5)

## 2018-04-05 LAB — COMPREHENSIVE METABOLIC PANEL
ALT: 19 U/L (ref 17–63)
AST: 21 U/L (ref 15–41)
Albumin: 3.8 g/dL (ref 3.5–5.0)
Alkaline Phosphatase: 86 U/L (ref 38–126)
Anion gap: 8 (ref 5–15)
BILIRUBIN TOTAL: 0.6 mg/dL (ref 0.3–1.2)
BUN: 22 mg/dL — AB (ref 6–20)
CHLORIDE: 107 mmol/L (ref 101–111)
CO2: 28 mmol/L (ref 22–32)
CREATININE: 1.14 mg/dL (ref 0.61–1.24)
Calcium: 9.1 mg/dL (ref 8.9–10.3)
GFR calc Af Amer: >60 mL/min (ref >60)
GFR calc non Af Amer: >60 mL/min (ref >60)
Glucose, Bld: 103 mg/dL — ABNORMAL HIGH (ref 65–99)
Potassium: 3.8 mmol/L (ref 3.5–5.1)
Sodium: 143 mmol/L (ref 135–145)
Total Protein: 6.6 g/dL (ref 6.5–8.1)

## 2018-04-05 LAB — URINALYSIS, ROUTINE W REFLEX MICROSCOPIC
Bilirubin Urine: NEGATIVE
GLUCOSE, UA: NEGATIVE mg/dL
HGB URINE DIPSTICK: NEGATIVE
Ketones, ur: NEGATIVE mg/dL
Leukocytes, UA: NEGATIVE
Nitrite: POSITIVE — AB
PROTEIN: NEGATIVE mg/dL
SPECIFIC GRAVITY, URINE: 1.025 (ref 1.005–1.030)
pH: 6 (ref 5.0–8.0)

## 2018-04-05 LAB — PROTIME-INR
INR: 0.99
Prothrombin Time: 13 seconds (ref 11.4–15.2)

## 2018-04-05 LAB — MAGNESIUM: Magnesium: 1.9 mg/dL (ref 1.7–2.4)

## 2018-04-05 LAB — TROPONIN I

## 2018-04-05 LAB — URINALYSIS, MICROSCOPIC (REFLEX): RBC / HPF: NONE SEEN RBC/hpf (ref 0–5)

## 2018-04-05 MED ORDER — CEPHALEXIN 250 MG PO CAPS
500.0000 mg | ORAL_CAPSULE | Freq: Once | ORAL | Status: AC
  Administered 2018-04-05: 500 mg via ORAL
  Filled 2018-04-05: qty 2

## 2018-04-05 MED ORDER — CEPHALEXIN 500 MG PO CAPS: 500.0000 mg | ORAL_CAPSULE | Freq: Three times a day (TID) | ORAL | 0 refills | Status: AC

## 2018-04-05 MED ORDER — IOPAMIDOL (ISOVUE-370) INJECTION 76%
100.0000 mL | Freq: Once | INTRAVENOUS | Status: AC | PRN
  Administered 2018-04-05: 100 mL via INTRAVENOUS

## 2018-04-05 NOTE — ED Triage Notes (Addendum)
Pt reports one week history of lower back pain with radiation down bilateral legs - states he has seen his PCP twice - reports he was prescribed TENS unit, Muscle Relaxers, Gabapentin, and given a Steroid Injection - he denies relief from any of those. He does report Xray was completed that revealed arthritis. Denies known injury. Denies incontinence. Pain is exacerbated by movement. Relieved by rest.

## 2018-04-05 NOTE — Discharge Instructions (Signed)
Your workup today showed evidence of a urinary tract infection.  We suspect this is causing the pain as it might be going to your kidneys.  We did not find other evidence of acute abnormalities and we feel you are safe for discharge home.  Please take the antibiotics for the next 2 weeks.  Please follow-up with your primary doctor.  If any symptoms change or worsen, please return to the nearest emergency department.  Please stay hydrated.

## 2018-04-05 NOTE — ED Notes (Signed)
Pt given rx x 1 for keflex. D/c home with family

## 2018-04-05 NOTE — ED Provider Notes (Signed)
[facility] EMERGENCY DEPARTMENT Provider Note   CSN: [id number] Arrival date & time: 04/05/18  0759     History   Chief Complaint Chief Complaint  Patient presents with  . Back Pain    HPI Joe Walls is a 67 y.o. male.  The history is provided by the patient, medical records and the spouse.  Back Pain   This is a new problem. The current episode started more than 1 week ago. The problem occurs constantly. The problem has not changed since onset.The pain is associated with no known injury. The pain is present in the lumbar spine. The quality of the pain is described as aching and stabbing. The pain radiates to the left thigh and right thigh. The pain is at a severity of 10/10. The pain is severe. Exacerbated by: walking. Associated symptoms include leg pain. Pertinent negatives include no chest pain, no fever, no numbness, no headaches, no abdominal pain, no abdominal swelling, no bowel incontinence, no perianal numbness, no bladder incontinence, no dysuria, no pelvic pain, no paresis, no tingling and no weakness. He has tried muscle relaxants and analgesics for the symptoms. The treatment provided no relief.    Past Medical History:  Diagnosis Date  . CAD (coronary artery disease)   . High cholesterol   . Hypertension   . Renal disorder     Patient Active Problem List   Diagnosis Date Noted  . HTN (hypertension), malignant 05/21/2012  . Chest pain 05/21/2012  . PTSD (post-traumatic stress disorder) 05/21/2012  . Hyperlipidemia 05/21/2012  . Family history of coronary artery disease 05/21/2012    Past Surgical History:  Procedure Laterality Date  . CORONARY ARTERY BYPASS GRAFT    . KNEE SURGERY          Home Medications    Prior to Admission medications   Medication Sig Start Date End Date Taking? Authorizing Provider  atorvastatin (LIPITOR) 20 MG tablet Take 1 tablet (20 mg total) by mouth daily. 05/21/12 05/21/13  Rai, Delene Ruffini, MD    chlorthalidone (HYGROTON) 25 MG tablet Take 1 tablet (25 mg total) by mouth daily. 05/21/12   Rai, Delene Ruffini, MD  Ergocalciferol (VITAMIN D2 PO) Take 1.25 mg by mouth.    [provider]  FLUoxetine (PROZAC) 20 MG capsule Take 20 mg by mouth daily.    [provider]  HYDROcodone-acetaminophen (LORCET) 10-650 MG per tablet Take 1 tablet by mouth every 6 (six) hours as needed.    [provider]  nitroGLYCERIN (NITROSTAT) 0.4 MG SL tablet Place 1 tablet (0.4 mg total) under the tongue every 5 (five) minutes x 3 doses as needed for chest pain. 05/21/12 05/21/13  Rai, Delene Ruffini, MD  propranolol (INDERAL) 80 MG tablet Take 1 tablet (80 mg total) by mouth 2 (two) times daily. 05/21/12   Rai, Delene Ruffini, MD  ticagrelor (BRILINTA) 90 MG TABS tablet Take 90 mg by mouth 2 (two) times daily.    [provider]    Family History Family History  Problem Relation Age of Onset  . Coronary artery disease Father 55       died of an MI    Social History Social History   Tobacco Use  . Smoking status: Former Smoker    Last attempt to quit: 05/21/1992    Years since quitting: 25.8  . Smokeless tobacco: Never Used  Substance Use Topics  . Alcohol use: Yes    Comment: beer every other day  . Drug  use: No     Allergies   Patient has no known allergies.   Review of Systems Review of Systems  Constitutional: Negative for chills, diaphoresis, fatigue and fever.  HENT: Negative for congestion.   Respiratory: Negative for cough, chest tightness and shortness of breath.   Cardiovascular: Negative for chest pain, palpitations and leg swelling.  Gastrointestinal: Negative for abdominal pain, bowel incontinence, constipation, diarrhea, nausea and vomiting.  Genitourinary: Negative for bladder incontinence, dysuria, flank pain, frequency and pelvic pain.  Musculoskeletal: Positive for back pain. Negative for neck pain and neck stiffness.  Skin: Negative for rash and  wound.  Neurological: Negative for tingling, weakness, light-headedness, numbness and headaches.  Psychiatric/Behavioral: Negative for agitation.  All other systems reviewed and are negative.    Physical Exam Updated Vital Signs BP 126/76 (BP Location: Left Arm)   Pulse 62   Temp 98.3 F (36.8 C) (Oral)   Resp 16   Ht 5\' 10"  (1.778 m)   Wt 84.8 kg (187 lb)   SpO2 97%   BMI 26.83 kg/m   Physical Exam  Constitutional: He is oriented to person, place, and time. He appears well-developed and well-nourished. No distress.  HENT:  Head: Normocephalic and atraumatic.  Mouth/Throat: Oropharynx is clear and moist. No oropharyngeal exudate.  Eyes: Pupils are equal, round, and reactive to light. EOM are normal.  Neck: Normal range of motion. Neck supple.  Cardiovascular: Normal rate.  No murmur heard. Pulmonary/Chest: Effort normal. No stridor. No respiratory distress. He has no wheezes. He exhibits no tenderness.  Abdominal: Soft. Bowel sounds are normal. There is no tenderness.  Musculoskeletal: Normal range of motion. He exhibits no edema or tenderness.       Lumbar back: He exhibits pain. He exhibits no tenderness, no edema, no deformity and no laceration.  Decreased left radial pulse when compared to right.  Ultrasound was utilized showing radial artery blood flow.  Lymphadenopathy:    He has no cervical adenopathy.  Neurological: He is alert and oriented to person, place, and time. He is not disoriented. He displays no tremor. No cranial nerve deficit or sensory deficit. He exhibits normal muscle tone. GCS eye subscore is 4. GCS verbal subscore is 5. GCS motor subscore is 6.  Skin: Capillary refill takes less than 2 seconds. He is not diaphoretic. No erythema. No pallor.  Psychiatric: He has a normal mood and affect.  Nursing note and vitals reviewed.    ED Treatments / Results  Labs (all labs ordered are listed, but only abnormal results are displayed) Labs Reviewed  CBC  WITH DIFFERENTIAL/PLATELET - Abnormal; Notable for the following components:      Result Value   RBC 4.07 (*)    Hemoglobin 12.9 (*)    All other components within normal limits  COMPREHENSIVE METABOLIC PANEL - Abnormal; Notable for the following components:   Glucose, Bld 103 (*)    BUN 22 (*)    All other components within normal limits  URINALYSIS, ROUTINE W REFLEX MICROSCOPIC - Abnormal; Notable for the following components:   APPearance CLOUDY (*)    Nitrite POSITIVE (*)    All other components within normal limits  URINALYSIS, MICROSCOPIC (REFLEX) - Abnormal; Notable for the following components:   Bacteria, UA MANY (*)    Squamous Epithelial / LPF 0-5 (*)    All other components within normal limits  URINE CULTURE  PROTIME-INR  TROPONIN I  MAGNESIUM    EKG EKG Interpretation  Date/Time:  Saturday April 05 2018 08:58:43 EDT Ventricular Rate:  57 PR Interval:    QRS Duration: 112 QT Interval:  427 QTC Calculation: 416 R Axis:   45 Text Interpretation:  Sinus rhythm Probable left atrial enlargement Incomplete left bundle branch block Low voltage, extremity leads Minimal ST elevation, inferior leads \ When compared to prior, no significant changes seen.  No STEMI Confirmed by Theda Belfast ([id number]) on 04/05/2018 9:00:49 AM Also confirmed by Theda Belfast ([id number]), editor Sheppard Evens [phone])  on 04/05/2018 10:37:15 AM   Radiology Ct Angio Chest/abd/pel For Dissection W And/or Wo Contrast  Result Date: 04/05/2018 CLINICAL DATA:  Low back pain radiating into both lower extremities. Decreased left radial pulse. EXAM: CT ANGIOGRAPHY CHEST, ABDOMEN AND PELVIS TECHNIQUE: Multidetector CT imaging through the chest, abdomen and pelvis was performed using the standard protocol during bolus administration of intravenous contrast. Multiplanar reconstructed images and MIPs were obtained and reviewed to evaluate the vascular anatomy. CONTRAST:  ISOVUE-370 IOPAMIDOL (ISOVUE-370)  INJECTION 76% COMPARISON:  None. FINDINGS: CTA CHEST FINDINGS Cardiovascular: The thoracic aorta is of normal caliber and demonstrates no evidence of aneurysmal disease or dissection. Scattered calcified plaque is present. No evidence of intramural hemorrhage. Status post prior CABG. Proximal great vessels are normally patent. The heart size is at the upper limits of normal. No pericardial fluid. Central pulmonary arteries are normal in caliber. Mediastinum/Nodes: No enlarged mediastinal, hilar, or axillary lymph nodes. Thyroid gland, trachea, and esophagus demonstrate no significant findings. Lungs/Pleura: There is no evidence of pulmonary edema, consolidation, pneumothorax, nodule or pleural fluid. Musculoskeletal: No chest wall abnormality. No acute or significant osseous findings. Review of the MIP images confirms the above findings. CTA ABDOMEN AND PELVIS FINDINGS VASCULAR Aorta: The abdominal aorta demonstrates scattered plaque without evidence of aneurysmal disease or dissection. Celiac: Normally patent.  Normally patent distal branch vessels. SMA: Minimal plaque in the proximal SMA trunk without significant stenosis. Distal branches are normally patent. Renals: Bilateral single renal arteries demonstrate mild atherosclerosis without significant stenosis. IMA: Normally patent. Inflow: Bilateral common iliac arteries demonstrate diffuse atherosclerosis without evidence of aneurysmal disease or significant stenosis. Internal and external iliac arteries are normally patent bilaterally. Common femoral arteries and femoral bifurcation show normal patency. Review of the MIP images confirms the above findings. NON-VASCULAR Hepatobiliary: No focal liver abnormality is seen. No gallstones, gallbladder wall thickening, or biliary dilatation. Pancreas: Unremarkable. No pancreatic ductal dilatation or surrounding inflammatory changes. Spleen: Normal in size without focal abnormality. Adrenals/Urinary Tract: Adrenal  glands are unremarkable. Kidneys are normal, without renal calculi, focal lesion, or hydronephrosis. Bladder is unremarkable. Stomach/Bowel: Bowel shows no evidence of obstruction or inflammation. No free air. Lymphatic: No enlarged lymph nodes identified in the abdomen or pelvis. Reproductive: Prostate is unremarkable. Other: There is a large left inguinal hernia containing a bladder diverticulum as well as some free fluid. Musculoskeletal: No significant bony abnormalities identified. Mild vacuum disc at L3-4 without significant disc space narrowing. No bony lesions or destruction. Review of the MIP images confirms the above findings. IMPRESSION: No acute findings in the chest, abdomen or pelvis. No evidence of acute aortic pathology. 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 Electronically Signed   By: Irish Lack M.D.   On: 04/05/2018 10:43    Procedures Procedures (including critical care time)  Medications Ordered in ED Medications  iopamidol (ISOVUE-370) 76 % injection 100 mL (100 mLs Intravenous Contrast Given 04/05/18 0956)  cephALEXin (KEFLEX) capsule 500 mg (500 mg Oral Given  04/05/18 1329)     Initial Impression / Assessment and Plan / ED Course  I have reviewed the triage vital signs and the nursing notes.  Pertinent labs & imaging results that were available during my care of the patient were reviewed by me and considered in my medical decision making (see chart for details).     MOZES SAGAR is a 67 y.o. male with a past medical history significant for CAD status post CABG, hypertension, and hypercholesterolemia who presents with 1 week of low back pain.  Patient reports that he had no injury but for the last week has been having severe back pain.  He describes it as 10 out of 10 in severity when he ambulates.  He reports the pain radiates into both the front and back of his upper legs.  He says that he has never had this  pain before.  He does report mild back pain in the past but is never had any surgeries or other management.  He says that on Monday he went see his PCP and had x-rays.  He reports that he was given baclofen, a steroid injection, and gabapentin without any relief in his pain.  He reports that he has had no chest pain, shortness of breath or palpitations.  No lightheadedness or syncopal episodes.  He otherwise has no urinary symptoms.  He denies any numbness, tingling, or weakness of his legs.  On exam, back is nontender.  Abdomen is nontender.  Patient has normal strength and sensation in his lower extremities.  Patient does have bilateral DP pulses that were strong.  Patient had negative straight leg raise testing on his bilateral legs.  Patient had no CVA tenderness.  Patient was found to have a decreased radial pulse in his left upper extremity however a Doppler ultrasound was able to find a pulse.  Patient had normal grip strength and normal sensation in the arms.  No arm pain described.  Lungs were clear and chest was nontender.  Overall I am concerned about the patient's back pain.  He reports no injury and has no back tenderness raising suspicion that this is just musculoskeletal pain.  He also did not respond to muscle relaxant, steroids, and gabapentin.  His straight leg raise testing was negative and the description of his pain being both on the anterior and posterior side of his legs sounds less like sciatica.  Although patient had bounding pulses in his lower extremities, I am somewhat concerned about a aortic or vascular cause of his pain including claudication.  With the patient having a decreased left radial pulse to the right, patient will have imaging to rule out an  aortic dissection causing his symptoms.  As patient is pain-free when he is not walking or moving, will hold on pain medications at this time.  Anticipate reassessment after diagnostic workup.  Diagnostic workup today revealed  evidence of urinary tract infection.  Given the pain in his back, I am concerned it is actually a pyelonephritis.  Patient's imaging showed no evidence of aortic dissection or aortic aneurysm.  Doubt claudication is the cause of his pain.  Suspect he may be a muscular skeletal component however patient will be treated with antibiotics for the UTI.  Patient given a dose of antibiotics in the emergency department and will follow up with PCP in several days.  Patient and family voiced understanding of the plan of care as well as return precautions.  Patient had no other questions or concerns  and was discharged in good condition.   Final Clinical Impressions(s) / ED Diagnoses   Final diagnoses:  Acute bilateral low back pain without sciatica  Pyelonephritis    ED Discharge Orders        Ordered    cephALEXin (KEFLEX) 500 MG capsule  3 times daily     04/05/18 1324      Clinical Impression: 1. Acute bilateral low back pain without sciatica   2. Pyelonephritis     Disposition: Discharge  Condition: Good  I have discussed the results, Dx and Tx plan with the pt(& family if present). He/she/they expressed understanding and agree(s) with the plan. Discharge instructions discussed at great length. Strict return precautions discussed and pt &/or family have verbalized understanding of the instructions. No further questions at time of discharge.    Discharge Medication List as of 04/05/2018  1:25 PM    START taking these medications   Details  cephALEXin (KEFLEX) 500 MG capsule Take 1 capsule (500 mg total) by mouth 3 (three) times daily for 14 days., Starting Sat 04/05/2018, Until Sat 04/19/2018, Print        Follow Up: Center, [facility] [address] C[place]ee L[place] [address] [phone]     [facility]MENT [address] [id number] VO [address] [place] [address] [phone]       Tegeler, Canary Brim, MD 04/05/18 1359

## 2018-04-05 NOTE — ED Notes (Signed)
ED Provider at bedside. 

## 2018-04-05 NOTE — ED Notes (Signed)
Xray is waiting on Labs for CT so patient can have both exams at once to reduce transportation of patient

## 2018-04-05 NOTE — ED Notes (Signed)
CT will need to wait on Bun/Creat/egfr to result prior to imaging with IV contrast per [facility] radiology protocol, pt >60 yrs of age, pt will also require a 20G AC IV for exam

## 2018-04-05 NOTE — ED Notes (Signed)
Family at bedside. 

## 2018-04-07 LAB — URINE CULTURE: Culture: >=100000 — AB

## 2018-04-08 ENCOUNTER — Telehealth: Payer: Self-pay | Admitting: Emergency Medicine

## 2018-04-08 NOTE — Telephone Encounter (Signed)
Post ED Visit - Positive Culture Follow-up  Culture report reviewed by antimicrobial stewardship pharmacist:  []  Enzo BiNathan Batchelder, Pharm.D. [x]  Celedonio MiyamotoJeremy Frens, Pharm.D., BCPS AQ-ID []  Garvin FilaMike Maccia, Pharm.D., BCPS []  Georgina PillionElizabeth Martin, Pharm.D., BCPS []  [place] St. JohnMinh Pham, [address] Rainbow BoulevardPharm.D., BCPS, AAHIVP []  Estella HuskMichelle Turner, Pharm.D., BCPS, AAHIVP []  Lysle Pearlachel Rumbarger, PharmD, BCPS []  Blake DivineShannon Parkey, PharmD []  Pollyann SamplesAndy Johnston, PharmD, BCPS  Positive urine culture Treated with cephalexin, organism sensitive to the same and no further patient follow-up is required at this time.  Berle MullMiller, Pollyanna Levay 04/08/2018, 11:34 AM

## 2018-05-10 ENCOUNTER — Other Ambulatory Visit: Payer: Self-pay

## 2018-05-10 ENCOUNTER — Emergency Department (HOSPITAL_COMMUNITY)
Admission: EM | Admit: 2018-05-10 | Discharge: 2018-05-10 | Disposition: A | Discharge status: Home / Self Care | Payer: BLUE CROSS/BLUE SHIELD | Attending: Emergency Medicine | Admitting: Emergency Medicine

## 2018-05-10 ENCOUNTER — Emergency Department (HOSPITAL_COMMUNITY): Payer: BLUE CROSS/BLUE SHIELD

## 2018-05-10 ENCOUNTER — Encounter (HOSPITAL_COMMUNITY): Payer: Self-pay

## 2018-05-10 DIAGNOSIS — Z87891 Personal history of nicotine dependence: Secondary | ICD-10-CM | POA: Insufficient documentation

## 2018-05-10 DIAGNOSIS — Z951 Presence of aortocoronary bypass graft: Secondary | ICD-10-CM | POA: Diagnosis not present

## 2018-05-10 DIAGNOSIS — K469 Unspecified abdominal hernia without obstruction or gangrene: Secondary | ICD-10-CM | POA: Diagnosis not present

## 2018-05-10 DIAGNOSIS — K4090 Unilateral inguinal hernia, without obstruction or gangrene, not specified as recurrent: Secondary | ICD-10-CM | POA: Insufficient documentation

## 2018-05-10 DIAGNOSIS — Z79899 Other long term (current) drug therapy: Secondary | ICD-10-CM | POA: Insufficient documentation

## 2018-05-10 DIAGNOSIS — I2510 Atherosclerotic heart disease of native coronary artery without angina pectoris: Secondary | ICD-10-CM | POA: Diagnosis not present

## 2018-05-10 DIAGNOSIS — K458 Other specified abdominal hernia without obstruction or gangrene: Secondary | ICD-10-CM | POA: Diagnosis present

## 2018-05-10 DIAGNOSIS — I10 Essential (primary) hypertension: Secondary | ICD-10-CM | POA: Diagnosis not present

## 2018-05-10 LAB — CBC WITH DIFFERENTIAL/PLATELET
BASOS ABS: 0 10*3/uL (ref 0.0–0.1)
Basophils Relative: 0 %
EOS PCT: 2 %
Eosinophils Absolute: 0.1 10*3/uL (ref 0.0–0.7)
HCT: 39.6 % (ref 39.0–52.0)
HEMOGLOBIN: 13.1 g/dL (ref 13.0–17.0)
LYMPHS PCT: 15 %
Lymphs Abs: 1.1 10*3/uL (ref 0.7–4.0)
MCH: 31.2 pg (ref 26.0–34.0)
MCHC: 33.1 g/dL (ref 30.0–36.0)
MCV: 94.3 fL (ref 78.0–100.0)
Monocytes Absolute: 0.5 10*3/uL (ref 0.1–1.0)
Monocytes Relative: 7 %
Neutro Abs: 5.2 10*3/uL (ref 1.7–7.7)
Neutrophils Relative %: 76 %
Platelets: 166 10*3/uL (ref 150–400)
RBC: 4.2 MIL/uL — AB (ref 4.22–5.81)
RDW: 12.3 % (ref 11.5–15.5)
WBC: 6.9 10*3/uL (ref 4.0–10.5)

## 2018-05-10 LAB — URINALYSIS, ROUTINE W REFLEX MICROSCOPIC
Bilirubin Urine: NEGATIVE
Glucose, UA: NEGATIVE mg/dL
HGB URINE DIPSTICK: NEGATIVE
Ketones, ur: NEGATIVE mg/dL
Leukocytes, UA: NEGATIVE
Nitrite: NEGATIVE
Protein, ur: NEGATIVE mg/dL
SPECIFIC GRAVITY, URINE: 1.009 (ref 1.005–1.030)
pH: 6 (ref 5.0–8.0)

## 2018-05-10 LAB — COMPREHENSIVE METABOLIC PANEL
ALK PHOS: 105 U/L (ref 38–126)
ALT: 16 U/L — AB (ref 17–63)
AST: 21 U/L (ref 15–41)
Albumin: 3.9 g/dL (ref 3.5–5.0)
Anion gap: 8 (ref 5–15)
BILIRUBIN TOTAL: 0.6 mg/dL (ref 0.3–1.2)
BUN: 15 mg/dL (ref 6–20)
CHLORIDE: 103 mmol/L (ref 101–111)
CO2: 29 mmol/L (ref 22–32)
Calcium: 9.6 mg/dL (ref 8.9–10.3)
Creatinine, Ser: 0.99 mg/dL (ref 0.61–1.24)
GFR calc Af Amer: >60 mL/min (ref >60)
Glucose, Bld: 109 mg/dL — ABNORMAL HIGH (ref 65–99)
Potassium: 4.1 mmol/L (ref 3.5–5.1)
Sodium: 140 mmol/L (ref 135–145)
Total Protein: 6.7 g/dL (ref 6.5–8.1)

## 2018-05-10 LAB — I-STAT CG4 LACTIC ACID, ED: LACTIC ACID, VENOUS: 0.9 mmol/L (ref 0.5–1.9)

## 2018-05-10 MED ORDER — IOHEXOL 300 MG/ML  SOLN
100.0000 mL | Freq: Once | INTRAMUSCULAR | Status: AC | PRN
  Administered 2018-05-10: 100 mL via INTRAVENOUS

## 2018-05-10 MED ORDER — SODIUM CHLORIDE 0.9 % IV BOLUS
1000.0000 mL | Freq: Once | INTRAVENOUS | Status: AC
  Administered 2018-05-10: 1000 mL via INTRAVENOUS

## 2018-05-10 NOTE — ED Provider Notes (Signed)
5:22 PM Care assumed from [facility].  At time of transfer care, patient is awaiting consultation for management instructions on patient with left inguinal pain.  Patient had CT scan that showed large inguinal hernia that appeared to have bladder present in it.  CT scan did not show evidence of bowel in the hernia or obstruction of the bowel.  Patient reports his pain is improved.  He says that when he has to urinate his hernia is extremely large.  He was concerned today because it was more bulbous than normal and was having worsened pain and would not go down.  He did say that since he urinated in the emergency department it has gone down in size and discomfort.  It appears that the patient's swelling is actually his bladder that is causing his hernial pain.  Will call urology for recommendations however I suspect patient will need to be managed as an outpatient for this.  As there does not appear to be bowel contained, will hold on calling general surgery initially.  5:29 PM Urology return my call and said that they do not feel he needs a Foley catheter placement if he is not retaining urine.  They feel he would likely be able to follow-up as an outpatient however they recommend consulting general surgery at this time.  Next  General surgery will be called.  General surgery felt this is appropriate for outpatient follow-up in the next few weeks.  Patient was informed of this and he agreed with the plan.  Patient instructed to urinate frequently to prevent buildup of urine in his hernia.  Patient and family agreed with plan of care and patient was discharged in good condition with understanding of plan of care and return precautions.   Clinical Impression: 1. Hernia of abdominal cavity   2. Left inguinal hernia     Disposition: Discharge  Condition: Good  I have discussed the results, Dx and Tx plan with the pt(& family if present). He/she/they expressed understanding and agree(s)  with the plan. Discharge instructions discussed at great length. Strict return precautions discussed and pt &/or family have verbalized understanding of the instructions. No further questions at time of discharge.    New Prescriptions   No medications on file    Follow Up: Surgery, [place] [address] [place] [address] [phone]     [facility] EMERGENCY DEPARTMENT [address] [id number] mc [place] [address] [phone]        Tegeler, Canary Brim, MD 05/10/18 [phone]

## 2018-05-10 NOTE — ED Triage Notes (Signed)
Pt endorses having a hernia that will not go back in. Pt went to [facility] and sent here. Pt endorses mild pain to area. VSS.

## 2018-05-10 NOTE — ED Notes (Signed)
ED Provider at bedside. 

## 2018-05-10 NOTE — ED Provider Notes (Signed)
[facility] EMERGENCY DEPARTMENT Provider Note   CSN: [id number] Arrival date & time: 05/10/18  1052     History   Chief Complaint Chief Complaint  Patient presents with  . Hernia    HPI Joe Walls is a 67 y.o. male.  HPI  Patient is a 67 year old male with a history of CAD status post CABG, hyperlipidemia, hypertension presenting for inguinal hernia.  Patient presents from urgent care, who referred him to the emergency department for evaluation.  Patient reports he has had a hernia that will periodically extend into the scrotum for many years, however it typically goes back in.  Patient reports that urination typically causes herniation.  Patient reports that around 2 AM, he urinated, and had sharp pain in the scrotum.  Patient said that it was burning with urination.  Patient reports that since then, he has been unable to get the hernia back up into the abdominal wall from the scrotum.  Patient denies any pain at present.  Patient denies any nausea, vomiting, change in bowel movements, diarrhea, constipation.  Patient denies any fever or chills.  Patient has never been evaluated by general surgery for hernia.  Past Medical History:  Diagnosis Date  . CAD (coronary artery disease)   . High cholesterol   . Hypertension   . Renal disorder     Patient Active Problem List   Diagnosis Date Noted  . HTN (hypertension), malignant 05/21/2012  . Chest pain 05/21/2012  . PTSD (post-traumatic stress disorder) 05/21/2012  . Hyperlipidemia 05/21/2012  . Family history of coronary artery disease 05/21/2012    Past Surgical History:  Procedure Laterality Date  . CORONARY ARTERY BYPASS GRAFT    . KNEE SURGERY          Home Medications    Prior to Admission medications   Medication Sig Start Date End Date Taking? Authorizing Provider  atorvastatin (LIPITOR) 20 MG tablet Take 1 tablet (20 mg total) by mouth daily. 05/21/12 04/05/18  Rai, Delene Ruffini, MD    chlorthalidone (HYGROTON) 25 MG tablet Take 1 tablet (25 mg total) by mouth daily. 05/21/12   Rai, Delene Ruffini, MD  Ergocalciferol (VITAMIN D2 PO) Take 1.25 mg by mouth.    [provider]  FLUoxetine (PROZAC) 20 MG capsule Take 20 mg by mouth daily.    [provider]  HYDROcodone-acetaminophen (LORCET) 10-650 MG per tablet Take 1 tablet by mouth every 6 (six) hours as needed.    [provider]  nitroGLYCERIN (NITROSTAT) 0.4 MG SL tablet Place 1 tablet (0.4 mg total) under the tongue every 5 (five) minutes x 3 doses as needed for chest pain. 05/21/12 05/21/13  Rai, Delene Ruffini, MD  propranolol (INDERAL) 80 MG tablet Take 1 tablet (80 mg total) by mouth 2 (two) times daily. 05/21/12   Rai, Delene Ruffini, MD  ticagrelor (BRILINTA) 90 MG TABS tablet Take 90 mg by mouth 2 (two) times daily.    [provider]    Family History Family History  Problem Relation Age of Onset  . Coronary artery disease Father 30       died of an MI    Social History Social History   Tobacco Use  . Smoking status: Former Smoker    Last attempt to quit: 05/21/1992    Years since quitting: 25.9  . Smokeless tobacco: Never Used  Substance Use Topics  . Alcohol use: Yes    Comment: beer every other day  . Drug use:  No     Allergies   Patient has no known allergies.   Review of Systems Review of Systems  Constitutional: Negative for chills and fever.  Cardiovascular: Negative for chest pain, palpitations and leg swelling.  Gastrointestinal: Negative for abdominal pain, diarrhea, nausea and vomiting.  Genitourinary: Positive for dysuria. Negative for difficulty urinating, flank pain and testicular pain.  All other systems reviewed and are negative.    Physical Exam Updated Vital Signs BP 129/78 (BP Location: Right Arm)   Pulse 66   Temp 98 F (36.7 C) (Oral)   Resp 17   Ht  (1.778 m)   Wt 81.2 kg (179 lb)   SpO2 99%   BMI 25.68 kg/m   Physical Exam   Constitutional: He appears well-developed and well-nourished. No distress.  Well-appearing male sitting comfortably on examination bed.  HENT:  Head: Normocephalic and atraumatic.  Mouth/Throat: Oropharynx is clear and moist.  Eyes: Pupils are equal, round, and reactive to light. Conjunctivae and EOM are normal.  Neck: Normal range of motion. Neck supple.  Cardiovascular: Normal rate, regular rhythm, S1 normal and S2 normal.  No murmur heard. Pulmonary/Chest: Effort normal and breath sounds normal. He has no wheezes. He has no rales.  Abdominal: Soft. He exhibits no distension. There is no tenderness. There is no guarding.  Genitourinary:  Genitourinary Comments: Examination performed with RN chaperone, Dois Davenport present.  Patient exhibits obvious scrotal swelling on left scrotum.  No erythema bilateral scrotum.  No tenderness overlying scrotal herniation.  On standing examination, scrotal swelling more pronounced.  Unable to reduce scrotal herniation.  Musculoskeletal: Normal range of motion. He exhibits no edema or deformity.  Lymphadenopathy:    He has no cervical adenopathy.  Neurological: He is alert.  Cranial nerves grossly intact. Patient moves extremities symmetrically and with good coordination.  Skin: Skin is warm and dry. No rash noted. No erythema.  Psychiatric: He has a normal mood and affect. His behavior is normal. Judgment and thought content normal.  Nursing note and vitals reviewed.    ED Treatments / Results  Labs (all labs ordered are listed, but only abnormal results are displayed) Labs Reviewed  URINE CULTURE  URINALYSIS, ROUTINE W REFLEX MICROSCOPIC  CBC WITH DIFFERENTIAL/PLATELET  COMPREHENSIVE METABOLIC PANEL  I-STAT CG4 LACTIC ACID, ED    EKG None  Radiology No results found.  Procedures Procedures (including critical care time)  Medications Ordered in ED Medications  sodium chloride 0.9 % bolus 1,000 mL (has no administration in time range)      Initial Impression / Assessment and Plan / ED Course  I have reviewed the triage vital signs and the nursing notes.  Pertinent labs & imaging results that were available during my care of the patient were reviewed by me and considered in my medical decision making (see chart for details).     Patient nontoxic-appearing, afebrile, and in no acute distress at present.  Patient with unreducible left likely indirect hernia.  I reviewed patient CT scan performed on 04-05-2018, which revealed a large left inguinal hernia with bladder diverticulum and free fluid involved in the herniation.  Suspect that the bladder involvement as cause of patient's dysuria when unable to reduce the hernia.  Patient is otherwise well-appearing, and have low suspicion for strangulation of the hernia at present, but will evaluate for circulation/incarceration of hernia with lactic acid, CMP, CBC, and CT abdomen pelvis with contrast.  Anticipate surgical consult.  Case discussed with Dr. Bary Castilla.  Patient transferred  to pod C, acute care from pod A, and evaluated Dr. Theda Belfast, who resumes care and performed urological and general surgery consultations.  Final Clinical Impressions(s) / ED Diagnoses   Final diagnoses:  Hernia of abdominal cavity  Left inguinal hernia    ED Discharge Orders    None       Delia Chimes 05/10/18 1901    Abelino Derrick, MD 05/11/18 [phone]

## 2018-05-10 NOTE — Discharge Instructions (Signed)
Your work-up today showed evidence of a left inguinal hernia causing your symptoms.  It appears there is some bladder contained in the hernia.  Please make sure you are draining her bladder frequently and urinating as this will help prevent your pain.  We did not find any evidence of obstruction or bowel and your hernia on imaging.  The urology and general surgery teams feel you are safe for discharge home and are appropriate for outpatient follow-up with general surgery.  If any symptoms change or worsen, please return to the nearest emergency department.

## 2018-05-10 NOTE — ED Notes (Signed)
Patient transported to CT 

## 2018-05-11 LAB — URINE CULTURE: Culture: NO GROWTH

## 2018-05-29 ENCOUNTER — Ambulatory Visit: Payer: Self-pay | Admitting: General Surgery

## 2018-05-29 NOTE — H&P (Signed)
History of Present Illness Axel Filler MD; 05/29/2018 11:17 AM) The patient is a 67 year old male who presents with an inguinal hernia. Referred by: Dr. Ramiro Harvest Chief Complaint: Left inguinal hernia  Patient is a 67 year old male with history of her previous CABG, CAD, who comes in with a left sliding inguinal hernia. Patient states that he was told he had a hernia there for approximately 25 years. He states more recently status symptoms pain to the left inguinal area decide at hernia. He states he notices it more when he holds his urine. Patient was seen in the ER and underwent CT scan. I did review these images personally. CT scan did reveal that he had a left sliding inguinal hernia with bladder pain. Patient states that since that time he has been urinary needing more frequently to help with the pain. This has helped.  Patient sees Dr. Hanley Hays his cardiologist at [facility]. He is currently only on aspirin.    Past Surgical History Maurilio Lovely; 05/29/2018 10:59 AM) Coronary Artery Bypass Graft  Knee Surgery  Bilateral.  Diagnostic Studies History Maurilio Lovely; 05/29/2018 10:59 AM) Colonoscopy  1-5 years ago  Allergies Maurilio Lovely; 05/29/2018 11:01 AM) No Known Drug Allergies [05/29/2018]: Allergies Reconciled   Medication History Maurilio Lovely; 05/29/2018 11:02 AM) Lisinopril (  Tablet, Oral) Active. AmLODIPine Besylate (  Tablet, Oral) Active. Isosorbide Mononitrate ER (  Tablet ER 24HR, Oral) Active. Levothyroxine Sodium ( Tablet, Oral) Active. Gabapentin (  Capsule, Oral) Active. Nucynta (  Tablet, Oral) Active. Sulfamethoxazole-Trimethoprim (800-160MG  Tablet, Oral) Active.  Social History Maurilio Lovely; 05/29/2018 10:59 AM) Alcohol use  Occasional alcohol use. Caffeine use  Carbonated beverages, Tea. No drug use  Tobacco use  Former smoker.  Family History Maurilio Lovely; 05/29/2018 10:59 AM) Alcohol  Abuse  Brother. Breast Cancer  Mother. Diabetes Mellitus  Daughter, Mother. Heart Disease  Father, Mother. Heart disease in male family member before age 76  Hypertension  Brother, Mother, Sister. Kidney Disease  Mother.  Other Problems Maurilio Lovely; 05/29/2018 10:59 AM) Back Pain  High blood pressure  Hypercholesterolemia  Sleep Apnea     Review of Systems Axel Filler MD; 05/29/2018 11:15 AM) General Present- Appetite Loss, Fatigue and Weight Loss. Not Present- Chills, Fever, Night Sweats and Weight Gain. Skin Not Present- Change in Wart/Mole, Dryness, Hives, Jaundice, New Lesions, Non-Healing Wounds, Rash and Ulcer. HEENT Present- Ringing in the Ears and Wears glasses/contact lenses. Not Present- Earache, Hearing Loss, Hoarseness, Nose Bleed, Oral Ulcers, Seasonal Allergies, Sinus Pain, Sore Throat, Visual Disturbances and Yellow Eyes. Respiratory Present- Snoring. Not Present- Bloody sputum, Chronic Cough, Difficulty Breathing and Wheezing. Breast Not Present- Breast Mass, Breast Pain, Nipple Discharge and Skin Changes. Cardiovascular Present- Rapid Heart Rate. Not Present- Chest Pain, Difficulty Breathing Lying Down, Leg Cramps, Palpitations, Shortness of Breath and Swelling of Extremities. Gastrointestinal Not Present- Abdominal Pain, Bloating, Bloody Stool, Change in Bowel Habits, Chronic diarrhea, Constipation, Difficulty Swallowing, Excessive gas, Gets full quickly at meals, Hemorrhoids, Indigestion, Nausea, Rectal Pain and Vomiting. Male Genitourinary Present- Painful Urination. Not Present- Blood in Urine, Change in Urinary Stream, Frequency, Impotence, Nocturia, Urgency and Urine Leakage. Musculoskeletal Present- Back Pain and Muscle Pain. Not Present- Joint Pain, Joint Stiffness, Muscle Weakness and Swelling of Extremities. Neurological Present- Decreased Memory. Not Present- Fainting, Headaches, Numbness, Seizures, Tingling, Tremor, Trouble walking and  Weakness. Psychiatric Not Present- Anxiety, Bipolar, Change in Sleep Pattern, Depression, Fearful and Frequent crying. Endocrine Not Present- Cold Intolerance, Excessive Hunger, Hair Changes, Heat Intolerance, Hot flashes and New Diabetes.  Hematology Not Present- Blood Thinners, Easy Bruising, Excessive bleeding, Gland problems, HIV and Persistent Infections. All other systems negative  Vitals Maurilio Lovely; 05/29/2018 11:02 AM) 05/29/2018 11:02 AM Weight: 181.13 lb Height: 70in Body Surface Area: 2 m Body Mass Index: 25.99 kg/m  Temp.: 98.75F(Oral)  Pulse: 65 (Regular)  BP: 130/82 (Sitting, Left Arm, Standard)       Physical Exam Axel Filler MD; 05/29/2018 11:17 AM) The physical exam findings are as follows: Note:Constitutional: No acute distress, conversant, appears stated age  Eyes: Anicteric sclerae, moist conjunctiva, no lid lag  Neck: No thyromegaly, trachea midline, no cervical lymphadenopathy  Lungs: Clear to auscultation biilaterally, normal respiratory effot  Cardiovascular: regular rate & rhythm, no murmurs, no peripheal edema, pedal pulses 2+  GI: Soft, no masses or hepatosplenomegaly, non-tender to palpation  MSK: Normal gait, no clubbing cyanosis, edema  Skin: No rashes, palpation reveals normal skin turgor  Psychiatric: Appropriate judgment and insight, oriented to person, place, and time  Abdomen Inspection Hernias - Inguinal hernia - Left - Reducible.    Assessment & Plan Axel Filler MD; 05/29/2018 11:17 AM) LEFT INGUINAL HERNIA (K40.90) Impression: 67 year old male with history of CAD, CABG, with left inguinal hernia, slider 1. The patient will like to proceed to the operating room for laparoscopic left inguinal hernia repair with mesh.  2. I discussed with the patient the signs and symptoms of incarceration and strangulation and the need to proceed to the ER should they occur.  3. I discussed with the patient the risks and  benefits of the procedure to include but not limited to: Infection, bleeding, damage to surrounding structures, possible need for further surgery, possible nerve pain, and possible recurrence. The patient was understanding and wishes to proceed.

## 2019-01-13 DIAGNOSIS — E782 Mixed hyperlipidemia: Secondary | ICD-10-CM | POA: Diagnosis not present

## 2019-01-13 DIAGNOSIS — I10 Essential (primary) hypertension: Secondary | ICD-10-CM | POA: Diagnosis not present

## 2019-01-13 DIAGNOSIS — Z79899 Other long term (current) drug therapy: Secondary | ICD-10-CM | POA: Diagnosis not present

## 2019-01-13 DIAGNOSIS — M542 Cervicalgia: Secondary | ICD-10-CM | POA: Diagnosis not present

## 2019-01-13 DIAGNOSIS — G8929 Other chronic pain: Secondary | ICD-10-CM | POA: Diagnosis not present

## 2019-01-13 DIAGNOSIS — R7303 Prediabetes: Secondary | ICD-10-CM | POA: Diagnosis not present

## 2019-02-13 DIAGNOSIS — Z79899 Other long term (current) drug therapy: Secondary | ICD-10-CM | POA: Diagnosis not present

## 2019-02-13 DIAGNOSIS — M542 Cervicalgia: Secondary | ICD-10-CM | POA: Diagnosis not present

## 2019-02-13 DIAGNOSIS — G8929 Other chronic pain: Secondary | ICD-10-CM | POA: Diagnosis not present

## 2019-02-13 DIAGNOSIS — R7303 Prediabetes: Secondary | ICD-10-CM | POA: Diagnosis not present

## 2019-02-13 DIAGNOSIS — I10 Essential (primary) hypertension: Secondary | ICD-10-CM | POA: Diagnosis not present

## 2019-03-12 ENCOUNTER — Other Ambulatory Visit: Payer: Self-pay

## 2019-03-12 NOTE — Patient Outreach (Signed)
  [facility]) Care Management Chronic Special Needs Program   03/12/2019  Name: Joe Walls, DOB: 02/12/1951  MRN: [id number]  The client was discussed in an interdisciplinary care team meeting.  The following issues were discussed:  Client's needs, Key risk triggers/risk stratification, Care Plan, Coordination of care and Issues/barriers to care  Participants present:  Jodean Lima, RNCM; Dudley Major, RNCM; Kathyrn Sheriff, [facility]  Recommendations/Plan: Heart Failure program, send educational information, send advanced directive information.  Follow up: in 2-4 months.  Kathyrn Sheriff, RN, MSN, [facility] Chronic Care Management Coordinator [facility] [phone]

## 2019-03-13 DIAGNOSIS — Z79899 Other long term (current) drug therapy: Secondary | ICD-10-CM | POA: Diagnosis not present

## 2019-03-13 DIAGNOSIS — Z951 Presence of aortocoronary bypass graft: Secondary | ICD-10-CM | POA: Diagnosis not present

## 2019-03-13 DIAGNOSIS — G8929 Other chronic pain: Secondary | ICD-10-CM | POA: Diagnosis not present

## 2019-03-13 DIAGNOSIS — M542 Cervicalgia: Secondary | ICD-10-CM | POA: Diagnosis not present

## 2019-03-13 DIAGNOSIS — E039 Hypothyroidism, unspecified: Secondary | ICD-10-CM | POA: Diagnosis not present

## 2019-04-09 DIAGNOSIS — M542 Cervicalgia: Secondary | ICD-10-CM | POA: Diagnosis not present

## 2019-04-09 DIAGNOSIS — G8929 Other chronic pain: Secondary | ICD-10-CM | POA: Diagnosis not present

## 2019-04-09 DIAGNOSIS — R7303 Prediabetes: Secondary | ICD-10-CM | POA: Diagnosis not present

## 2019-04-09 DIAGNOSIS — Z79899 Other long term (current) drug therapy: Secondary | ICD-10-CM | POA: Diagnosis not present

## 2019-04-09 DIAGNOSIS — I10 Essential (primary) hypertension: Secondary | ICD-10-CM | POA: Diagnosis not present

## 2019-05-07 DIAGNOSIS — M129 Arthropathy, unspecified: Secondary | ICD-10-CM | POA: Diagnosis not present

## 2019-05-07 DIAGNOSIS — I10 Essential (primary) hypertension: Secondary | ICD-10-CM | POA: Diagnosis not present

## 2019-05-07 DIAGNOSIS — M542 Cervicalgia: Secondary | ICD-10-CM | POA: Diagnosis not present

## 2019-05-07 DIAGNOSIS — Z0000 Encounter for general adult medical examination without abnormal findings: Secondary | ICD-10-CM | POA: Diagnosis not present

## 2019-05-07 DIAGNOSIS — R7303 Prediabetes: Secondary | ICD-10-CM | POA: Diagnosis not present

## 2019-05-07 DIAGNOSIS — G8929 Other chronic pain: Secondary | ICD-10-CM | POA: Diagnosis not present

## 2019-05-07 DIAGNOSIS — E559 Vitamin D deficiency, unspecified: Secondary | ICD-10-CM | POA: Diagnosis not present

## 2019-05-07 DIAGNOSIS — Z79899 Other long term (current) drug therapy: Secondary | ICD-10-CM | POA: Diagnosis not present

## 2019-05-07 DIAGNOSIS — E039 Hypothyroidism, unspecified: Secondary | ICD-10-CM | POA: Diagnosis not present

## 2019-05-07 DIAGNOSIS — E782 Mixed hyperlipidemia: Secondary | ICD-10-CM | POA: Diagnosis not present

## 2019-05-12 ENCOUNTER — Other Ambulatory Visit: Payer: Self-pay

## 2019-05-12 NOTE — Patient Outreach (Signed)
  [facility]) Care Management Chronic Special Needs Program  05/12/2019  Name: Joe Walls DOB: 1951-05-26  MRN: [id number]  Mr. Rodrecus Kaltenbach is enrolled in a chronic special needs plan for Heart Failure. Chronic Care Management Coordinator telephoned client to review health risk assessment and to develop individualized care plan.  Introduced the chronic care management program, importance of client participation, and taking their care plan to all provider appointments and inpatient facilities.    Subjective: Client reports history of Bypass in March 2017 and reports this is when he had heart failure. He also reports history of HTN, He is not on a diuretic currently, but follows up with cardiologist and has an appointment with cardiologist within the month. He states he weighs self about 3-4 times a week and weight is usually the same. Client reports no issues with managing medications. He denies any issues or concern. Client reports he is not sure he received previous information from [facility].  Goals Addressed            This Visit's Progress   .  Acknowledge receipt of Advanced Directive package   On track   . Advanced Care Planning complete as directed by client within the next 6 months.   On track   . Client understands the importance of follow-up with providers by attending scheduled visits   On track   . Client verbalize knowledge of Heart Failure disease self management skills within the next 6 months.   On track    Please ask cardiologist about your Target weight at next visit. Please ask cardiologist about your action plan at next visit.    . Client will report keeping track of weights per provider recommendations within the next 6 months.   On track    Please Follow up with your cardiologist regarding weight recommendations and Target weight.    . Client will report no worsening of symptoms related to heart disease within the next 6 months    On track   .  COMPLETED: Client will verbalize knowledge of self management of Hypertension as evidences by BP reading of 140/90 or less; or as defined by provider   On track    Reports has blood pressure cuff and checks. Reports Blood pressures range 130/70 Reports Follows up with providers Reports Takes medications as prescribed.    . Maintain timely refills of Heart Failure medication as prescribed within the year    On track   . Obtain annual  Lipid Profile, LDL-C   On track   . Visit Primary Care Provider or Cardiologist at least 2 times per year   On track     Covid-19 precautions discussed, RNCM reinforced the availability of the 24 hour nurse advice line. Also encouraged client to call the health care concierge for benefits questions and RNCM as needed.   Plan:  Send successful outreach letter with a copy of their individualized care plan and Send educational material Send advanced directive packet. Chronic care management coordination will outreach in: 6-7 months.   Kathyrn Sheriff, RN, MSN, [facility] Chronic Care Management Coordinator [facility] [phone]

## 2019-05-15 ENCOUNTER — Other Ambulatory Visit: Payer: Self-pay

## 2019-05-15 NOTE — Patient Outreach (Signed)
  [facility]) Care Management Chronic Special Needs Program    05/15/2019  Name: Joe Walls, DOB: February 23, 1951  MRN: [id number]   Mr. Willey Dundore is enrolled in a chronic special needs plan. Medications provided by client added to medication list.  Plan: continue to follow as previously scheduled.  Kathyrn Sheriff, RN, MSN, [facility] Chronic Care Management Coordinator [facility] [phone]

## 2019-06-08 DIAGNOSIS — M542 Cervicalgia: Secondary | ICD-10-CM | POA: Diagnosis not present

## 2019-06-08 DIAGNOSIS — G8929 Other chronic pain: Secondary | ICD-10-CM | POA: Diagnosis not present

## 2019-06-08 DIAGNOSIS — I10 Essential (primary) hypertension: Secondary | ICD-10-CM | POA: Diagnosis not present

## 2019-06-08 DIAGNOSIS — Z79899 Other long term (current) drug therapy: Secondary | ICD-10-CM | POA: Diagnosis not present

## 2019-06-08 DIAGNOSIS — E039 Hypothyroidism, unspecified: Secondary | ICD-10-CM | POA: Diagnosis not present

## 2019-07-07 DIAGNOSIS — Z79899 Other long term (current) drug therapy: Secondary | ICD-10-CM | POA: Diagnosis not present

## 2019-07-07 DIAGNOSIS — M542 Cervicalgia: Secondary | ICD-10-CM | POA: Diagnosis not present

## 2019-07-07 DIAGNOSIS — G8929 Other chronic pain: Secondary | ICD-10-CM | POA: Diagnosis not present

## 2019-07-07 DIAGNOSIS — I10 Essential (primary) hypertension: Secondary | ICD-10-CM | POA: Diagnosis not present

## 2019-08-07 DIAGNOSIS — I10 Essential (primary) hypertension: Secondary | ICD-10-CM | POA: Diagnosis not present

## 2019-08-07 DIAGNOSIS — E039 Hypothyroidism, unspecified: Secondary | ICD-10-CM | POA: Diagnosis not present

## 2019-08-07 DIAGNOSIS — M542 Cervicalgia: Secondary | ICD-10-CM | POA: Diagnosis not present

## 2019-08-07 DIAGNOSIS — G8929 Other chronic pain: Secondary | ICD-10-CM | POA: Diagnosis not present

## 2019-08-07 DIAGNOSIS — R7303 Prediabetes: Secondary | ICD-10-CM | POA: Diagnosis not present

## 2019-08-07 DIAGNOSIS — Z79899 Other long term (current) drug therapy: Secondary | ICD-10-CM | POA: Diagnosis not present

## 2019-08-07 DIAGNOSIS — Z1159 Encounter for screening for other viral diseases: Secondary | ICD-10-CM | POA: Diagnosis not present

## 2019-09-04 DIAGNOSIS — G8929 Other chronic pain: Secondary | ICD-10-CM | POA: Diagnosis not present

## 2019-09-04 DIAGNOSIS — M542 Cervicalgia: Secondary | ICD-10-CM | POA: Diagnosis not present

## 2019-09-04 DIAGNOSIS — R7303 Prediabetes: Secondary | ICD-10-CM | POA: Diagnosis not present

## 2019-09-04 DIAGNOSIS — I10 Essential (primary) hypertension: Secondary | ICD-10-CM | POA: Diagnosis not present

## 2019-09-04 DIAGNOSIS — Z79899 Other long term (current) drug therapy: Secondary | ICD-10-CM | POA: Diagnosis not present

## 2019-10-01 DIAGNOSIS — G8929 Other chronic pain: Secondary | ICD-10-CM | POA: Diagnosis not present

## 2019-10-01 DIAGNOSIS — I10 Essential (primary) hypertension: Secondary | ICD-10-CM | POA: Diagnosis not present

## 2019-10-01 DIAGNOSIS — Z23 Encounter for immunization: Secondary | ICD-10-CM | POA: Diagnosis not present

## 2019-10-01 DIAGNOSIS — E782 Mixed hyperlipidemia: Secondary | ICD-10-CM | POA: Diagnosis not present

## 2019-10-01 DIAGNOSIS — M542 Cervicalgia: Secondary | ICD-10-CM | POA: Diagnosis not present

## 2019-10-16 DIAGNOSIS — I6529 Occlusion and stenosis of unspecified carotid artery: Secondary | ICD-10-CM | POA: Diagnosis not present

## 2019-10-16 DIAGNOSIS — I517 Cardiomegaly: Secondary | ICD-10-CM | POA: Diagnosis not present

## 2019-10-16 DIAGNOSIS — M79605 Pain in left leg: Secondary | ICD-10-CM | POA: Diagnosis not present

## 2019-10-16 DIAGNOSIS — I2510 Atherosclerotic heart disease of native coronary artery without angina pectoris: Secondary | ICD-10-CM | POA: Diagnosis not present

## 2019-10-16 DIAGNOSIS — I739 Peripheral vascular disease, unspecified: Secondary | ICD-10-CM | POA: Diagnosis not present

## 2019-10-16 DIAGNOSIS — Z7189 Other specified counseling: Secondary | ICD-10-CM | POA: Diagnosis not present

## 2019-10-16 DIAGNOSIS — M79604 Pain in right leg: Secondary | ICD-10-CM | POA: Diagnosis not present

## 2019-10-16 DIAGNOSIS — I318 Other specified diseases of pericardium: Secondary | ICD-10-CM | POA: Diagnosis not present

## 2019-10-30 DIAGNOSIS — G8929 Other chronic pain: Secondary | ICD-10-CM | POA: Diagnosis not present

## 2019-10-30 DIAGNOSIS — M542 Cervicalgia: Secondary | ICD-10-CM | POA: Diagnosis not present

## 2019-11-02 ENCOUNTER — Other Ambulatory Visit: Payer: Self-pay

## 2019-11-02 NOTE — Patient Outreach (Signed)
  [name]) Care Management Chronic Special Needs Program    11/02/2019  Name: Joe Walls, DOB: [birth year]  MRN: [id number]   Mr. Joe Walls is enrolled in a chronic special needs plan for Heart Failure. RNCM called to follow up and review individualized care plan. No answer. HIPAA compliant message left.  Plan: RNCM will attempt outreach in 1-2 weeks.  Thea Silversmith, RN, MSN, [place] [phone]

## 2019-11-13 ENCOUNTER — Other Ambulatory Visit: Payer: Self-pay

## 2019-11-13 NOTE — Patient Outreach (Signed)
  [facility]) Care Management Chronic Special Needs Program    11/13/2019  Name: Joe Walls, DOB: 1951/06/24  MRN: [id number]   Mr. Joe Walls is enrolled in a chronic special needs plan. RNCM called to follow up and review individualized care plan. No answer. HIPAA compliant message left.   Plan: Chronic care management coordinator will attempt outreach within 2-3 weeks.  Thea Silversmith, RN, MSN, [place] [phone]

## 2019-11-18 ENCOUNTER — Other Ambulatory Visit: Payer: Self-pay

## 2019-11-18 NOTE — Patient Outreach (Signed)
  [place] [facility]) Care Management Chronic Special Needs Program  11/18/2019  Name: Joe Walls DOB: [birth year]  MRN: [id number]  Mr. Sonia Stickels is enrolled in a chronic special needs plan for Heart Failure. Reviewed and updated care plan.  Subjective: client reports he is doing well. Client states he is able to obtain all his medications. And attend provider visits as scheduled. Client reports he has not been prescribed a diuretic. Client weighs self, but not daily. He is aware that he has lost a couple pounds since last conversation. Client reports he has been walking. He denies any edema, denies any shortness of breath. Client is without any issues or concerns at this time.   Goals Addressed            This Visit's Progress   . COMPLETED:  Acknowledge receipt of Advanced Directive package       Voiced receipt of advanced directives.    . COMPLETED: Advanced Care Planning complete as directed by client within the next 6 months.       Per client will complete at his leisure.    . COMPLETED: Client understands the importance of follow-up with providers by attending scheduled visits       Voiced importance of attending provider visits.    . Client verbalize knowledge of Heart Failure disease self management skills within the next 6 months.   On track    Please ask cardiologist about your Target weight at next visit. Please ask cardiologist about your action plan at next visit.    . Client will report keeping track of weights per provider recommendations within the next 6 months.   On track    Please Follow up with your cardiologist regarding weight recommendations and Target weight.    . COMPLETED: Client will report no worsening of symptoms related to heart disease within the next 6 months        Denies any worsening of symptoms related to heart disease.    . COMPLETED: Maintain timely refills of Heart Failure medication as prescribed within the year       Denies any difficulty obtaining medications.    . COMPLETED: Obtain annual  Lipid Profile, LDL-C       Per client done.    . Visit Primary Care Provider or Cardiologist at least 2 times per year   On track     Covid-19 precautions discussed. RNCM encouraged client to call 24 hour nurse advice line as needed. Encouraged client to call healthcare concierge as needed for benefits questions. RNCM also encouraged client to contact RNCM as needed.  Plan: RNCM will send updated care plan to client. Send updated care plan to primary care provider. RNCM will follow up with client per tier level within the next 6-9 months.   Thea Silversmith, RN, MSN, [place] [phone]   .

## 2019-12-03 DIAGNOSIS — E039 Hypothyroidism, unspecified: Secondary | ICD-10-CM | POA: Diagnosis not present

## 2019-12-03 DIAGNOSIS — G8929 Other chronic pain: Secondary | ICD-10-CM | POA: Diagnosis not present

## 2019-12-03 DIAGNOSIS — M542 Cervicalgia: Secondary | ICD-10-CM | POA: Diagnosis not present

## 2019-12-03 DIAGNOSIS — E782 Mixed hyperlipidemia: Secondary | ICD-10-CM | POA: Diagnosis not present

## 2019-12-03 DIAGNOSIS — R7303 Prediabetes: Secondary | ICD-10-CM | POA: Diagnosis not present

## 2019-12-03 DIAGNOSIS — Z79899 Other long term (current) drug therapy: Secondary | ICD-10-CM | POA: Diagnosis not present

## 2019-12-03 DIAGNOSIS — Z951 Presence of aortocoronary bypass graft: Secondary | ICD-10-CM | POA: Diagnosis not present

## 2019-12-03 DIAGNOSIS — E559 Vitamin D deficiency, unspecified: Secondary | ICD-10-CM | POA: Diagnosis not present

## 2019-12-03 DIAGNOSIS — Z1159 Encounter for screening for other viral diseases: Secondary | ICD-10-CM | POA: Diagnosis not present

## 2020-01-04 DIAGNOSIS — M542 Cervicalgia: Secondary | ICD-10-CM | POA: Diagnosis not present

## 2020-01-04 DIAGNOSIS — G8929 Other chronic pain: Secondary | ICD-10-CM | POA: Diagnosis not present

## 2020-01-04 DIAGNOSIS — Z79899 Other long term (current) drug therapy: Secondary | ICD-10-CM | POA: Diagnosis not present

## 2020-02-04 DIAGNOSIS — G8929 Other chronic pain: Secondary | ICD-10-CM | POA: Diagnosis not present

## 2020-02-04 DIAGNOSIS — Z79899 Other long term (current) drug therapy: Secondary | ICD-10-CM | POA: Diagnosis not present

## 2020-02-04 DIAGNOSIS — Z951 Presence of aortocoronary bypass graft: Secondary | ICD-10-CM | POA: Diagnosis not present

## 2020-02-04 DIAGNOSIS — I10 Essential (primary) hypertension: Secondary | ICD-10-CM | POA: Diagnosis not present

## 2020-02-04 DIAGNOSIS — M542 Cervicalgia: Secondary | ICD-10-CM | POA: Diagnosis not present

## 2020-03-03 DIAGNOSIS — E782 Mixed hyperlipidemia: Secondary | ICD-10-CM | POA: Diagnosis not present

## 2020-03-03 DIAGNOSIS — E039 Hypothyroidism, unspecified: Secondary | ICD-10-CM | POA: Diagnosis not present

## 2020-03-03 DIAGNOSIS — Z79899 Other long term (current) drug therapy: Secondary | ICD-10-CM | POA: Diagnosis not present

## 2020-03-03 DIAGNOSIS — R7303 Prediabetes: Secondary | ICD-10-CM | POA: Diagnosis not present

## 2020-03-03 DIAGNOSIS — I10 Essential (primary) hypertension: Secondary | ICD-10-CM | POA: Diagnosis not present

## 2020-03-03 DIAGNOSIS — Z1159 Encounter for screening for other viral diseases: Secondary | ICD-10-CM | POA: Diagnosis not present

## 2020-03-03 DIAGNOSIS — M542 Cervicalgia: Secondary | ICD-10-CM | POA: Diagnosis not present

## 2020-03-03 DIAGNOSIS — G8929 Other chronic pain: Secondary | ICD-10-CM | POA: Diagnosis not present

## 2020-03-31 DIAGNOSIS — I10 Essential (primary) hypertension: Secondary | ICD-10-CM | POA: Diagnosis not present

## 2020-03-31 DIAGNOSIS — M542 Cervicalgia: Secondary | ICD-10-CM | POA: Diagnosis not present

## 2020-03-31 DIAGNOSIS — G8929 Other chronic pain: Secondary | ICD-10-CM | POA: Diagnosis not present

## 2020-03-31 DIAGNOSIS — Z79899 Other long term (current) drug therapy: Secondary | ICD-10-CM | POA: Diagnosis not present

## 2020-03-31 DIAGNOSIS — E039 Hypothyroidism, unspecified: Secondary | ICD-10-CM | POA: Diagnosis not present

## 2020-04-19 DIAGNOSIS — M79605 Pain in left leg: Secondary | ICD-10-CM | POA: Diagnosis not present

## 2020-04-19 DIAGNOSIS — M79604 Pain in right leg: Secondary | ICD-10-CM | POA: Diagnosis not present

## 2020-04-19 DIAGNOSIS — I2582 Chronic total occlusion of coronary artery: Secondary | ICD-10-CM | POA: Diagnosis not present

## 2020-04-22 ENCOUNTER — Other Ambulatory Visit: Payer: Self-pay

## 2020-04-22 ENCOUNTER — Ambulatory Visit (INDEPENDENT_AMBULATORY_CARE_PROVIDER_SITE_OTHER): Payer: HMO | Admitting: Cardiovascular Disease

## 2020-04-22 ENCOUNTER — Encounter: Payer: Self-pay | Admitting: Cardiovascular Disease

## 2020-04-22 DIAGNOSIS — I779 Disorder of arteries and arterioles, unspecified: Secondary | ICD-10-CM | POA: Insufficient documentation

## 2020-04-22 DIAGNOSIS — I6522 Occlusion and stenosis of left carotid artery: Secondary | ICD-10-CM | POA: Diagnosis not present

## 2020-04-22 NOTE — Progress Notes (Signed)
04/22/2020 [name]   1951/02/03  [id number]  Primary Physician Joe Mariscal, MD Primary Cardiologist: Joe Harp MD Joe Walls, [place]  HPI:  Joe Walls is a 69 y.o. mildly overweight married African-American male father of 78, grandfather of seven grandchildren who was referred to me by Dr. Claudie Walls for evaluation of symptomatic internal carotid artery stenosis.  His cardiovascular risk factor profile is notable for remote tobacco abuse having quit in nineteen ninety-four, treated hypertension hyperlipidemia.  He does have a positive family history with a father who died of a myocardial infarction at age 74.  He has never had a heart attack or stroke.  He did have CABG x4 at [facility] hospital March 2017 and is currently pain-free with regards to that.  He currently had carotid Dopplers performed 6 months ago that was "technically challenging" but suggested a 8099% left ICA stenosis.  He is recently been experiencing some right-sided paresthesias.  He saw Dr. Claudie Walls who put him on Plavix.  A carotid CTA scheduled for next Thursday.  Should this prove to show a hemodynamically significant lesion on that side we will refer him to [facility] for surgical evaluation.   Current Meds  Medication Sig  . amLODipine (NORVASC) 10 MG tablet Take 10 mg by mouth daily.  Marland Kitchen aspirin EC 81 MG tablet Take 81 mg by mouth daily.  Marland Kitchen atorvastatin (LIPITOR) 10 MG tablet Take 10 mg by mouth daily.  . carvedilol (COREG) 3.125 MG tablet Take 3.125 mg by mouth 2 (two) times daily with a meal.  . Ergocalciferol (VITAMIN D2 PO) Take 1.25 mg by mouth.  Marland Kitchen FLUoxetine (PROZAC) 20 MG capsule Take 20 mg by mouth daily.  Marland Kitchen gabapentin (NEURONTIN) 300 MG capsule Take 300 mg by mouth daily.  Marland Kitchen HYDROcodone-acetaminophen (LORCET) 10-650 MG per tablet Take 1 tablet by mouth every 6 (six) hours as needed.  Marland Kitchen levothyroxine (SYNTHROID) 100 MCG tablet Take 100 mcg by mouth daily before breakfast.  .  lisinopril (ZESTRIL) 5 MG tablet Take 5 mg by mouth daily.  . Multiple Vitamins-Minerals (CENTRUM SILVER PO) Take 1 tablet by mouth daily.  . nitroGLYCERIN (NITROSTAT) 0.4 MG SL tablet Place 1 tablet (0.4 mg total) under the tongue every 5 (five) minutes x 3 doses as needed for chest pain.     No Known Allergies  Social History   Socioeconomic History  . Marital status: Married    Spouse name: Not on file  . Number of children: Not on file  . Years of education: Not on file  . Highest education level: Not on file  Occupational History  . Occupation: retired    Comment: Tour manager  Tobacco Use  . Smoking status: Former Smoker    Quit date: 05/21/1992    Years since quitting: 27.9  . Smokeless tobacco: Never Used  Substance and Sexual Activity  . Alcohol use: Yes    Comment: beer every other day  . Drug use: No  . Sexual activity: Not on file  Other Topics Concern  . Not on file  Social History Narrative  . Not on file   Social Determinants of Health   Financial Resource Strain:   . Difficulty of Paying Living Expenses:   Food Insecurity:   . Worried About Charity fundraiser in the Last Year:   . Arboriculturist in the Last Year:   Transportation Needs:   . Film/video editor (Medical):   Marland Kitchen  Lack of Transportation (Non-Medical):   Physical Activity:   . Days of Exercise per Week:   . Minutes of Exercise per Session:   Stress:   . Feeling of Stress :   Social Connections:   . Frequency of Communication with Friends and Family:   . Frequency of Social Gatherings with Friends and Family:   . Attends Religious Services:   . Active Member of Clubs or Organizations:   . Attends Banker Meetings:   Marland Kitchen Marital Status:   Intimate Partner Violence:   . Fear of Current or Ex-Partner:   . Emotionally Abused:   Marland Kitchen Physically Abused:   . Sexually Abused:      Review of Systems: General: negative for chills, fever, night sweats or weight changes.    Cardiovascular: negative for chest pain, dyspnea on exertion, edema, orthopnea, palpitations, paroxysmal nocturnal dyspnea or shortness of breath Dermatological: negative for rash Respiratory: negative for cough or wheezing Urologic: negative for hematuria Abdominal: negative for nausea, vomiting, diarrhea, bright red blood per rectum, melena, or hematemesis Neurologic: negative for visual changes, syncope, or dizziness All other systems reviewed and are otherwise negative except as noted above.    Blood pressure 126/72, pulse (!) 59, height 5\' 10"  (1.778 m), weight 184 lb 9.6 oz (83.7 kg), SpO2 98 %.  General appearance: alert and no distress Neck: no adenopathy, no carotid bruit, no JVD, supple, symmetrical, trachea midline and thyroid not enlarged, symmetric, no tenderness/mass/nodules Lungs: clear to auscultation bilaterally Heart: regular rate and rhythm, S1, S2 normal, no murmur, click, rub or gallop Extremities: extremities normal, atraumatic, no cyanosis or edema Pulses: 2+ and symmetric Skin: Skin color, texture, turgor normal. No rashes or lesions Neurologic: Alert and oriented X 3, normal strength and tone. Normal symmetric reflexes. Normal coordination and gait  EKG sinus bradycardia fifty-nine with nonspecific IVCD.  I personally reviewed this EKG.  ASSESSMENT AND PLAN:   Carotid artery disease Joe Walls) [name] was referred to me by Dr. Riley Walls for left internal carotid artery stenosis.  He apparently had a technically difficult carotid Doppler study 6 months ago suggesting a left ICA stenosis in the 80 to 99% range.  He has been experiencing some right-sided paresthesias.  He is scheduled to have a CTA performed of his carotid arteries next Thursday.  If this confirms high-grade disease he will need to be referred to [facility] for vascular surgical consultation.  He was placed on Plavix by Dr. Saturday.      Joe Hays MD FACP,FACC,FAHA, [facility] 04/22/2020 9:41 AM

## 2020-04-22 NOTE — Patient Instructions (Signed)
Follow-Up: At [facility], you and your health needs are our priority.  As part of our continuing mission to provide you with exceptional heart care, we have created designated Provider Care Teams.  These Care Teams include your primary Cardiologist (physician) and Advanced Practice Providers (APPs -  Physician Assistants and Nurse Practitioners) who all work together to provide you with the care you need, when you need it.  We recommend signing up for the patient portal called "MyChart".  Sign up information is provided on this After Visit Summary.  MyChart is used to connect with patients for Virtual Visits (Telemedicine).  Patients are able to view lab/test results, encounter notes, upcoming appointments, etc.  Non-urgent messages can be sent to your provider as well.   To learn more about what you can do with MyChart, go to [email or web address].    Your next appointment:   AS NEEDED with Dr. Allyson Sabal  Other Instructions We will get CT results-once Dr. Allyson Sabal reviews we will notify you of plan

## 2020-04-22 NOTE — Assessment & Plan Note (Signed)
Joe Walls was referred to me by Dr. Hanley Hays for left internal carotid artery stenosis.  He apparently had a technically difficult carotid Doppler study 6 months ago suggesting a left ICA stenosis in the 80 to 99% range.  He has been experiencing some right-sided paresthesias.  He is scheduled to have a CTA performed of his carotid arteries next Thursday.  If this confirms high-grade disease he will need to be referred to [facility] for vascular surgical consultation.  He was placed on Plavix by Dr. Hanley Hays.

## 2020-04-28 DIAGNOSIS — I6529 Occlusion and stenosis of unspecified carotid artery: Secondary | ICD-10-CM | POA: Diagnosis not present

## 2020-05-02 DIAGNOSIS — M542 Cervicalgia: Secondary | ICD-10-CM | POA: Diagnosis not present

## 2020-05-02 DIAGNOSIS — G8929 Other chronic pain: Secondary | ICD-10-CM | POA: Diagnosis not present

## 2020-05-03 ENCOUNTER — Telehealth: Payer: Self-pay | Admitting: *Deleted

## 2020-05-03 ENCOUNTER — Ambulatory Visit: Payer: Self-pay

## 2020-05-03 NOTE — Telephone Encounter (Signed)
Fax received and given to Dr. Allyson Sabal to review.

## 2020-05-03 NOTE — Telephone Encounter (Signed)
Received fax but recieved carotid US  [facility] to request CTA carotid again.  CTA carotid completed on 4/29 She does not see results but is going to look into this and return my call.     She returned call-study has not been read, she is going to get this read and faxed to [place].

## 2020-05-03 NOTE — Telephone Encounter (Signed)
Called [facility] to center to obtain CTA carotid completed on Thursday 4/29.   Dr. Allyson Sabal to review to determine plan of care.    Report to be faxed to [phone].

## 2020-05-04 ENCOUNTER — Telehealth: Payer: Self-pay | Admitting: *Deleted

## 2020-05-04 DIAGNOSIS — I6522 Occlusion and stenosis of left carotid artery: Secondary | ICD-10-CM

## 2020-05-04 NOTE — Telephone Encounter (Signed)
Spoke with pt, dr berry reviewed the CT results and has decided the patient needs to see dr Myra Gianotti. Referral placed and patient aware someone will call to schedule appointment.

## 2020-05-25 ENCOUNTER — Ambulatory Visit: Payer: Self-pay

## 2020-05-25 DIAGNOSIS — I447 Left bundle-branch block, unspecified: Secondary | ICD-10-CM | POA: Diagnosis not present

## 2020-05-25 DIAGNOSIS — I340 Nonrheumatic mitral (valve) insufficiency: Secondary | ICD-10-CM | POA: Diagnosis not present

## 2020-05-25 DIAGNOSIS — Z7902 Long term (current) use of antithrombotics/antiplatelets: Secondary | ICD-10-CM | POA: Diagnosis not present

## 2020-05-25 DIAGNOSIS — E039 Hypothyroidism, unspecified: Secondary | ICD-10-CM | POA: Diagnosis not present

## 2020-05-25 DIAGNOSIS — I208 Other forms of angina pectoris: Secondary | ICD-10-CM | POA: Diagnosis not present

## 2020-05-25 DIAGNOSIS — R600 Localized edema: Secondary | ICD-10-CM | POA: Diagnosis not present

## 2020-05-25 DIAGNOSIS — R072 Precordial pain: Secondary | ICD-10-CM | POA: Diagnosis not present

## 2020-05-25 DIAGNOSIS — I2510 Atherosclerotic heart disease of native coronary artery without angina pectoris: Secondary | ICD-10-CM | POA: Diagnosis not present

## 2020-05-25 DIAGNOSIS — Z79899 Other long term (current) drug therapy: Secondary | ICD-10-CM | POA: Diagnosis not present

## 2020-05-25 DIAGNOSIS — Z87891 Personal history of nicotine dependence: Secondary | ICD-10-CM | POA: Diagnosis not present

## 2020-05-25 DIAGNOSIS — I10 Essential (primary) hypertension: Secondary | ICD-10-CM | POA: Diagnosis not present

## 2020-05-25 DIAGNOSIS — M542 Cervicalgia: Secondary | ICD-10-CM | POA: Diagnosis not present

## 2020-05-25 DIAGNOSIS — Z9989 Dependence on other enabling machines and devices: Secondary | ICD-10-CM | POA: Diagnosis not present

## 2020-05-25 DIAGNOSIS — Z7982 Long term (current) use of aspirin: Secondary | ICD-10-CM | POA: Diagnosis not present

## 2020-05-25 DIAGNOSIS — Z951 Presence of aortocoronary bypass graft: Secondary | ICD-10-CM | POA: Diagnosis not present

## 2020-05-25 DIAGNOSIS — G4733 Obstructive sleep apnea (adult) (pediatric): Secondary | ICD-10-CM | POA: Diagnosis not present

## 2020-05-25 DIAGNOSIS — R0789 Other chest pain: Secondary | ICD-10-CM | POA: Diagnosis not present

## 2020-05-25 DIAGNOSIS — G8929 Other chronic pain: Secondary | ICD-10-CM | POA: Diagnosis not present

## 2020-05-25 DIAGNOSIS — I25118 Atherosclerotic heart disease of native coronary artery with other forms of angina pectoris: Secondary | ICD-10-CM | POA: Diagnosis not present

## 2020-05-25 DIAGNOSIS — Z8679 Personal history of other diseases of the circulatory system: Secondary | ICD-10-CM | POA: Diagnosis not present

## 2020-05-25 DIAGNOSIS — E785 Hyperlipidemia, unspecified: Secondary | ICD-10-CM | POA: Diagnosis not present

## 2020-05-25 DIAGNOSIS — R079 Chest pain, unspecified: Secondary | ICD-10-CM | POA: Diagnosis not present

## 2020-05-27 MED ORDER — TAPENTADOL HCL 50 MG PO TABS
50.00 | ORAL_TABLET | ORAL | Status: DC
Start: ? — End: 2020-05-27

## 2020-05-27 MED ORDER — PRAZOSIN HCL 1 MG PO CAPS
2.00 | ORAL_CAPSULE | ORAL | Status: DC
Start: 2020-05-25 — End: 2020-05-27

## 2020-05-27 MED ORDER — LISINOPRIL 5 MG PO TABS
5.00 | ORAL_TABLET | ORAL | Status: DC
Start: 2020-05-26 — End: 2020-05-27

## 2020-05-27 MED ORDER — AMLODIPINE BESYLATE 5 MG PO TABS
10.00 | ORAL_TABLET | ORAL | Status: DC
Start: 2020-05-26 — End: 2020-05-27

## 2020-05-27 MED ORDER — CARVEDILOL 3.125 MG PO TABS
3.13 | ORAL_TABLET | ORAL | Status: DC
Start: 2020-05-26 — End: 2020-05-27

## 2020-05-27 MED ORDER — ENOXAPARIN SODIUM 40 MG/0.4ML ~~LOC~~ SOLN
40.00 | SUBCUTANEOUS | Status: DC
Start: 2020-05-26 — End: 2020-05-27

## 2020-05-27 MED ORDER — ACETAMINOPHEN 325 MG PO TABS
650.00 | ORAL_TABLET | ORAL | Status: DC
Start: ? — End: 2020-05-27

## 2020-05-27 MED ORDER — ASPIRIN 81 MG PO TBEC
81.00 | DELAYED_RELEASE_TABLET | ORAL | Status: DC
Start: 2020-05-26 — End: 2020-05-27

## 2020-05-27 MED ORDER — CLOPIDOGREL BISULFATE 75 MG PO TABS
75.00 | ORAL_TABLET | ORAL | Status: DC
Start: 2020-05-26 — End: 2020-05-27

## 2020-05-27 MED ORDER — ATORVASTATIN CALCIUM 40 MG PO TABS
40.00 | ORAL_TABLET | ORAL | Status: DC
Start: 2020-05-25 — End: 2020-05-27

## 2020-05-27 MED ORDER — LEVOTHYROXINE SODIUM 100 MCG PO TABS
100.00 | ORAL_TABLET | ORAL | Status: DC
Start: 2020-05-26 — End: 2020-05-27

## 2020-06-02 DIAGNOSIS — Z79899 Other long term (current) drug therapy: Secondary | ICD-10-CM | POA: Diagnosis not present

## 2020-06-02 DIAGNOSIS — M542 Cervicalgia: Secondary | ICD-10-CM | POA: Diagnosis not present

## 2020-06-02 DIAGNOSIS — G8929 Other chronic pain: Secondary | ICD-10-CM | POA: Diagnosis not present

## 2020-06-27 ENCOUNTER — Other Ambulatory Visit: Payer: Self-pay

## 2020-06-27 ENCOUNTER — Encounter: Payer: Self-pay | Admitting: Surgery

## 2020-06-27 ENCOUNTER — Ambulatory Visit: Payer: HMO | Admitting: Surgery

## 2020-06-27 VITALS — BP 123/72 | HR 57 | Temp 97.6°F | Resp 16 | Ht 70.0 in | Wt 184.0 lb

## 2020-06-27 DIAGNOSIS — I6523 Occlusion and stenosis of bilateral carotid arteries: Secondary | ICD-10-CM | POA: Diagnosis not present

## 2020-06-27 NOTE — Progress Notes (Signed)
[facility] [place]  Patient name: Joe Walls MRN: [id number] DOB: 03/28/1951 Sex: male   REQUESTING PROVIDER:    Dr.Berry   REASON FOR CONSULT:    Carotid disease  HISTORY OF PRESENT ILLNESS:   TAHMID Walls is a 69 y.o. male, who is referred by Dr. Gwenlyn Found for evaluation of carotid disease.  He saw Dr. Gwenlyn Found in April of this year.  He had carotid Dopplers performed 6 months ago that were technically challenging but suggested greater than 80% left-sided stenosis.  At that time Dr. Alvester Chou noted that the patient was having some right-sided paresthesias.  He had been started recently on Plavix and was scheduled to get a CT angiogram.  Unfortunately, the patient never got his CT angiogram.  He is here today for further evaluation.  The patient states that his pain has been on both arms and is also associated with some neck pain.  He describes this as radiating down both arms.  He has not had any symptoms in a while.  He denies slurred speech or amaurosis fugax.   Patient has a history of tobacco abuse in the past.  He has a history of premature cardiovascular disease in his father who had an MI at 62.  The patient has a history of CABG in 2017.  He is medically managed for hypertension with an ACE inhibitor.  He takes a statin for hypercholesterolemia.  He is on dual antiplatelet therapy with aspirin Plavix.  PAST MEDICAL HISTORY    Past Medical History:  Diagnosis Date  . CAD (coronary artery disease)   . High cholesterol   . Hypertension   . Renal disorder      FAMILY HISTORY   Family History  Problem Relation Age of Onset  . Coronary artery disease Father 46       died of an MI    SOCIAL HISTORY:   Social History   Socioeconomic History  . Marital status: Married    Spouse name: Not on file  . Number of children: Not on file  . Years of education: Not on file  . Highest education level: Not on file    Occupational History  . Occupation: retired    Comment: Tour manager  Tobacco Use  . Smoking status: Former Smoker    Quit date: 05/21/1992    Years since quitting: 28.1  . Smokeless tobacco: Never Used  Substance and Sexual Activity  . Alcohol use: Yes    Comment: beer every other day  . Drug use: No  . Sexual activity: Not on file  Other Topics Concern  . Not on file  Social History Narrative  . Not on file   Social Determinants of Health   Financial Resource Strain:   . Difficulty of Paying Living Expenses:   Food Insecurity:   . Worried About Charity fundraiser in the Last Year:   . Arboriculturist in the Last Year:   Transportation Needs:   . Film/video editor (Medical):   Marland Kitchen Lack of Transportation (Non-Medical):   Physical Activity:   . Days of Exercise per Week:   . Minutes of Exercise per Session:   Stress:   . Feeling of Stress :   Social Connections:   . Frequency of Communication with Friends and Family:   . Frequency of Social Gatherings with Friends and Family:   . Attends Religious Services:   . Active Member of Clubs or Organizations:   .  Attends Banker Meetings:   Marland Kitchen Marital Status:   Intimate Partner Violence:   . Fear of Current or Ex-Partner:   . Emotionally Abused:   Marland Kitchen Physically Abused:   . Sexually Abused:     ALLERGIES:    No Known Allergies  CURRENT MEDICATIONS:    Current Outpatient Medications  Medication Sig Dispense Refill  . amLODipine (NORVASC) 10 MG tablet Take 10 mg by mouth daily.    Marland Kitchen aspirin EC 81 MG tablet Take 81 mg by mouth daily.    Marland Kitchen atorvastatin (LIPITOR) 10 MG tablet Take 10 mg by mouth daily.    . carvedilol (COREG) 3.125 MG tablet Take 3.125 mg by mouth 2 (two) times daily with a meal.    . clopidogrel (PLAVIX) 75 MG tablet Take by mouth.    . Ergocalciferol (VITAMIN D2 PO) Take 1.25 mg by mouth.    Marland Kitchen FLUoxetine (PROZAC) 20 MG capsule Take 20 mg by mouth daily.    Marland Kitchen gabapentin (NEURONTIN)  300 MG capsule Take 300 mg by mouth daily.    Marland Kitchen levothyroxine (SYNTHROID) 100 MCG tablet Take 100 mcg by mouth daily before breakfast.    . lisinopril (ZESTRIL) 5 MG tablet Take 5 mg by mouth daily.    . Multiple Vitamins-Minerals (CENTRUM SILVER PO) Take 1 tablet by mouth daily.    . NUCYNTA 100 MG TABS Take 1 tablet by mouth 4 (four) times daily.    Marland Kitchen HYDROcodone-acetaminophen (LORCET) 10-650 MG per tablet Take 1 tablet by mouth every 6 (six) hours as needed. (Patient not taking: Reported on 06/27/2020)    . nitroGLYCERIN (NITROSTAT) 0.4 MG SL tablet Place 1 tablet (0.4 mg total) under the tongue every 5 (five) minutes x 3 doses as needed for chest pain. 60 tablet 3   No current facility-administered medications for this visit.    REVIEW OF SYSTEMS:   [X]  denotes positive finding, [ ]  denotes negative finding Cardiac  Comments:  Chest pain or chest pressure:    Shortness of breath upon exertion: x   Short of breath when lying flat:    Irregular heart rhythm:        Vascular    Pain in calf, thigh, or hip brought on by ambulation:    Pain in feet at night that wakes you up from your sleep:     Blood clot in your veins:    Leg swelling:  x       Pulmonary    Oxygen at home:    Productive cough:     Wheezing:         Neurologic    Sudden weakness in arms or legs:     Sudden numbness in arms or legs:     Sudden onset of difficulty speaking or slurred speech:    Temporary loss of vision in one eye:     Problems with dizziness:         Gastrointestinal    Blood in stool:      Vomited blood:         Genitourinary    Burning when urinating:     Blood in urine:        Psychiatric    Major depression:         Hematologic    Bleeding problems:    Problems with blood clotting too easily:        Skin    Rashes or ulcers:        Constitutional  Fever or chills:     PHYSICAL EXAM:   Vitals:   06/27/20 1410 06/27/20 1413  BP: 125/74 123/72  Pulse: (!) 56 (!) 57    Resp: 16   Temp: 97.6 F (36.4 C)   TempSrc: Temporal   SpO2: 98%   Weight: 184 lb (83.5 kg)   Height: 5\' 10"  (1.778 m)     GENERAL: The patient is a well-nourished male, in no acute distress. The vital signs are documented above. CARDIAC: There is a regular rate and rhythm.  VASCULAR: Palpable pedal pulses bilaterally.  Palpable brachial pulses bilaterally PULMONARY: Nonlabored respirations ABDOMEN: Soft and non-tender.  No pulsatile mass palpated MUSCULOSKELETAL: There are no major deformities or cyanosis. NEUROLOGIC: No focal weakness or paresthesias are detected. SKIN: There are no ulcers or rashes noted. PSYCHIATRIC: The patient has a normal affect.  STUDIES:    ASSESSMENT and PLAN   Carotid stenosis: I do not have any imaging today.  I think his last study was an ultrasound from 6 months ago that suggested 80 to 99% left carotid stenosis however this was a technically difficult study, and I cannot locate the report.  Regardless, I think he needs a CT angiogram to definitively define his anatomy to determine whether or not he is a candidate for carotid stenting or carotid endarterectomy.  I am going to try to get the study done this week and I will call him once it is been performed to determine the next step in management.   , MD, FACS [facility] [phone] Pager [phone]

## 2020-06-28 ENCOUNTER — Other Ambulatory Visit: Payer: Self-pay

## 2020-06-28 DIAGNOSIS — I6523 Occlusion and stenosis of bilateral carotid arteries: Secondary | ICD-10-CM | POA: Diagnosis not present

## 2020-06-28 NOTE — Patient Outreach (Signed)
  [facility]) Care Management Chronic Special Needs Program    06/28/2020  Name: Joe Walls, DOB: [birth year]  MRN: [id number]   Mr. Ina Poupard is enrolled in a chronic special needs plan for Heart Failure.  RNCM called to follow up and update individualized care plan. No answer. HIPAA compliant message left.   Plan: Chronic care management coordinator will attempt outreach within 1-2 weeks.  Kathyrn Sheriff, RN, MSN, [facility] Chronic Care Management Coordinator [facility] [phone]

## 2020-06-29 LAB — TIQ- AMBIGUOUS ORDER

## 2020-06-30 ENCOUNTER — Ambulatory Visit (HOSPITAL_BASED_OUTPATIENT_CLINIC_OR_DEPARTMENT_OTHER): Payer: HMO

## 2020-06-30 ENCOUNTER — Other Ambulatory Visit: Payer: Self-pay

## 2020-06-30 ENCOUNTER — Ambulatory Visit (HOSPITAL_BASED_OUTPATIENT_CLINIC_OR_DEPARTMENT_OTHER): Admission: RE | Admit: 2020-06-30 | Payer: HMO | Source: Ambulatory Visit

## 2020-06-30 DIAGNOSIS — G8929 Other chronic pain: Secondary | ICD-10-CM | POA: Diagnosis not present

## 2020-06-30 DIAGNOSIS — Z131 Encounter for screening for diabetes mellitus: Secondary | ICD-10-CM | POA: Diagnosis not present

## 2020-06-30 DIAGNOSIS — E782 Mixed hyperlipidemia: Secondary | ICD-10-CM | POA: Diagnosis not present

## 2020-06-30 DIAGNOSIS — Z0000 Encounter for general adult medical examination without abnormal findings: Secondary | ICD-10-CM | POA: Diagnosis not present

## 2020-06-30 DIAGNOSIS — E559 Vitamin D deficiency, unspecified: Secondary | ICD-10-CM | POA: Diagnosis not present

## 2020-06-30 DIAGNOSIS — M542 Cervicalgia: Secondary | ICD-10-CM | POA: Diagnosis not present

## 2020-06-30 DIAGNOSIS — E039 Hypothyroidism, unspecified: Secondary | ICD-10-CM | POA: Diagnosis not present

## 2020-06-30 DIAGNOSIS — Z79899 Other long term (current) drug therapy: Secondary | ICD-10-CM | POA: Diagnosis not present

## 2020-06-30 DIAGNOSIS — Z1159 Encounter for screening for other viral diseases: Secondary | ICD-10-CM | POA: Diagnosis not present

## 2020-06-30 DIAGNOSIS — M129 Arthropathy, unspecified: Secondary | ICD-10-CM | POA: Diagnosis not present

## 2020-06-30 NOTE — Patient Outreach (Signed)
  [facility]) Care Management Chronic Special Needs Program    06/30/2020  Name: Joe Walls, DOB: 25-Nov-1951  MRN: [id number]   Mr. Joe Walls is enrolled in a chronic special needs plan. RNCM called to follow up, update care plan. Client reports this is not a good time, he is on the way to an appointment.  Plan: RNCM will outreach to client within the next 1-2 weeks.  Kathyrn Sheriff, RN, MSN, [facility] Chronic Care Management Coordinator [facility] [phone]

## 2020-07-01 ENCOUNTER — Ambulatory Visit: Payer: HMO | Admitting: Surgery

## 2020-07-01 ENCOUNTER — Encounter: Payer: Self-pay | Admitting: Surgery

## 2020-07-01 ENCOUNTER — Other Ambulatory Visit: Payer: Self-pay

## 2020-07-01 DIAGNOSIS — I6523 Occlusion and stenosis of bilateral carotid arteries: Secondary | ICD-10-CM

## 2020-07-06 ENCOUNTER — Ambulatory Visit (HOSPITAL_BASED_OUTPATIENT_CLINIC_OR_DEPARTMENT_OTHER)
Admission: RE | Admit: 2020-07-06 | Discharge: 2020-07-06 | Disposition: A | Discharge status: Home / Self Care | Payer: HMO | Source: Ambulatory Visit | Attending: Surgery | Admitting: Surgery

## 2020-07-06 ENCOUNTER — Other Ambulatory Visit: Payer: Self-pay

## 2020-07-06 DIAGNOSIS — I6523 Occlusion and stenosis of bilateral carotid arteries: Secondary | ICD-10-CM | POA: Insufficient documentation

## 2020-07-06 DIAGNOSIS — I672 Cerebral atherosclerosis: Secondary | ICD-10-CM | POA: Diagnosis not present

## 2020-07-06 DIAGNOSIS — I6503 Occlusion and stenosis of bilateral vertebral arteries: Secondary | ICD-10-CM | POA: Diagnosis not present

## 2020-07-06 MED ORDER — IOHEXOL 350 MG/ML SOLN
100.0000 mL | Freq: Once | INTRAVENOUS | Status: AC | PRN
  Administered 2020-07-06: 100 mL via INTRAVENOUS

## 2020-07-06 NOTE — Patient Outreach (Addendum)
[facility]) Care Management Chronic Special Needs Program  07/06/2020  Name: Joe Walls DOB: 17-Oct-1951  MRN: [id number]  Mr. Joe Walls is enrolled in a Chronic Special Needs Plan. RNCM called to follow up, discuss health risk assessment. No answer. HIPPA compliant message left. 3rd outreach attempt. Care plan updated based on health risk assessment and available information.  Goals Addressed            This Visit's Progress   .  Acknowledge receipt of Advanced Directive package   On track    Per latest health risk assessment, you requested an advance directive packet. Mailed advance directive packet. Emmi education provided: "advance directive". Review and call if you have any questions.    . Client verbalize knowledge of Heart Failure disease self management skills within the next 6 months.   On track    Goal renewed: Review [employer] calendar sent in the mail for Heart Failure information. Continue to attend visit with providers as scheduled. Emmi education provided: "heart failure, adult". Review and call if you have any questions.     . Client will report keeping track of weights per provider recommendations within the next 6 months.   On track    Unable to discuss with client. Emmi education provided: "how to weigh yourself". Review and call RN if you have any questions.  Call your provider if you gain 3 pounds overnight or gain 5 pounds in a week. Contact your doctor if you have any questions or concerns.    . Client will report no worsening of symptoms related to heart disease within the next 6 months    On track    Notify provider for symptoms of chest pain, sweating, nausea/vomiting, irregular heartbeat, palpitations, rapid heart rate, shortness of breath, dizziness or fainting. Call 911 for severe symptoms of chest pain or shortness of breath.  Take medications as prescribed. Follow a low salt meal plan, limit or avoid drinks with  alcohol. Follow up with your cardiologist(heart doctor) at least yearly. Emmi education provided on, "coronary artery disease". Review and call RN if you have any questions.    . Client will verbalize knowledge of self management of Hypertension as evidences by BP reading of 140/90 or less; or as defined by provider   On track    Take blood pressure medications as ordered.  Plan to eat low salt and heart healthy meals with fruits, vegetables, whole grains, lean protein and limit fat and sugars. Check blood pressure regularly. If you do not have a blood pressure monitor, see the over the counter benefit catalog. Increase activity as tolerated. Emmi education provided, "dash diet". Review and call RN if you have any questions.    . Obtain annual  Lipid Profile, LDL-C   On track    Try to avoid saturated fats, trans-fats and eat more fiber. Plan to take a statin (cholesterol) medicine as prescribed. Emmi education provided on "Lipid Test". Review and call if you have any questions.    . Visit Primary Care Provider or Cardiologist at least 2 times per year   On track    Last  noted Cardiology visit noted 04/22/2020; last primary care visit noted 12/03/2019 Goal continued: It is important to attend provider visits as scheduled for recommended labs, procedures and medication refills.      Plan: RNCM will attempt outreach per tier level within the next 9-12 months.   Kathyrn Sheriff, RN, MSN, [facility] Chronic Care Management Coordinator [facility]  [employer] [phone]

## 2020-07-07 ENCOUNTER — Ambulatory Visit (INDEPENDENT_AMBULATORY_CARE_PROVIDER_SITE_OTHER): Payer: HMO | Admitting: Surgery

## 2020-07-07 ENCOUNTER — Encounter: Payer: Self-pay | Admitting: Surgery

## 2020-07-07 DIAGNOSIS — I6523 Occlusion and stenosis of bilateral carotid arteries: Secondary | ICD-10-CM | POA: Diagnosis not present

## 2020-07-07 NOTE — H&P (View-Only) (Signed)
°           ° °[facility] [place] ° °Patient name: Joe Walls MRN: [id number] DOB: 05/26/1951 Sex: male ° ° °  ° °Virtual Visit via Telephone Note  ° °This visit type was conducted due to national recommendations for restrictions regarding the COVID-19 Pandemic (e.g. social distancing) in an effort to limit this patient's exposure and mitigate transmission in our community.  Due to his co-morbid illnesses, this patient is at least at moderate risk for complications without adequate follow up.  This format is felt to be most appropriate for this patient at this time.  The patient did not have access to video technology/had technical difficulties with video requiring transitioning to audio format only (telephone).  All issues noted in this document were discussed and addressed.  No physical exam could be performed with this format.   ° °Patient Location: Home °Provider Location: Office/Clinic ° ° ° °REASON FOR APPOINTMENT:  °  °Follow up ° °HISTORY OF PRESENT ILLNESS:  ° °Joe Walls is a 69 y.o. male, who I saw on 06/25/2020 for evaluation of symptomatic carotid stenosis at the request of Dr. Berry.  In April he had some right-sided paresthesias.  He was supposed to get a CT angiogram however this did not occur.  I ordered a CT scan for further evaluation because his carotid duplex was technically challenging.  I called him today to discuss the results.  He reports no new symptoms. ° °  °Patient has a history of tobacco abuse in the past.  He has a history of premature cardiovascular disease in his father who had an MI at 56.  The patient has a history of CABG in 2017.  He is medically managed for hypertension with an ACE inhibitor.  He takes a statin for hypercholesterolemia.  He is on dual antiplatelet therapy with aspirin Plavix. ° ° ° ° ° °PAST MEDICAL HISTORY  ° ° °Past Medical History:  °Diagnosis Date  °• CAD (coronary artery disease)   °• High cholesterol   °• Hypertension     °• Renal disorder   ° ° ° °FAMILY HISTORY  ° °Family History  °Problem Relation Age of Onset  °• Coronary artery disease Father 56  °     died of an MI  ° ° °SOCIAL HISTORY:  ° °Social History  ° °Socioeconomic History  °• Marital status: Married  °  Spouse name: Not on file  °• Number of children: Not on file  °• Years of education: Not on file  °• Highest education level: Not on file  °Occupational History  °• Occupation: retired  °  Comment: Postal worker  °Tobacco Use  °• Smoking status: Former Smoker  °  Quit date: 05/21/1992  °  Years since quitting: 28.1  °• Smokeless tobacco: Never Used  °Substance and Sexual Activity  °• Alcohol use: Yes  °  Comment: beer every other day  °• Drug use: No  °• Sexual activity: Not on file  °Other Topics Concern  °• Not on file  °Social History Narrative  °• Not on file  ° °Social Determinants of Health  ° °Financial Resource Strain:   °• Difficulty of Paying Living Expenses:   °Food Insecurity:   °• Worried About Running Out of Food in the Last Year:   °• Ran Out of Food in the Last Year:   °Transportation Needs:   °• Lack of Transportation (Medical):   °• Lack of Transportation (  Non-Medical):   °Physical Activity:   °• Days of Exercise per Week:   °• Minutes of Exercise per Session:   °Stress:   °• Feeling of Stress :   °Social Connections:   °• Frequency of Communication with Friends and Family:   °• Frequency of Social Gatherings with Friends and Family:   °• Attends Religious Services:   °• Active Member of Clubs or Organizations:   °• Attends Club or Organization Meetings:   °• Marital Status:   °Intimate Partner Violence:   °• Fear of Current or Ex-Partner:   °• Emotionally Abused:   °• Physically Abused:   °• Sexually Abused:   ° ° °ALLERGIES:  ° ° °No Known Allergies ° °CURRENT MEDICATIONS:  ° ° °Current Outpatient Medications  °Medication Sig Dispense Refill  °• amLODipine (NORVASC) 10 MG tablet Take 10 mg by mouth daily.    °• aspirin EC 81 MG tablet Take 81 mg by  mouth daily.    °• atorvastatin (LIPITOR) 10 MG tablet Take 10 mg by mouth daily.    °• carvedilol (COREG) 3.125 MG tablet Take 3.125 mg by mouth 2 (two) times daily with a meal.    °• clopidogrel (PLAVIX) 75 MG tablet Take by mouth.    °• Ergocalciferol (VITAMIN D2 PO) Take 1.25 mg by mouth.    °• FLUoxetine (PROZAC) 20 MG capsule Take 20 mg by mouth daily.    °• gabapentin (NEURONTIN) 300 MG capsule Take 300 mg by mouth daily.    °• HYDROcodone-acetaminophen (LORCET) 10-650 MG per tablet Take 1 tablet by mouth every 6 (six) hours as needed. (Patient not taking: Reported on 06/27/2020)    °• levothyroxine (SYNTHROID) 100 MCG tablet Take 100 mcg by mouth daily before breakfast.    °• lisinopril (ZESTRIL) 5 MG tablet Take 5 mg by mouth daily.    °• Multiple Vitamins-Minerals (CENTRUM SILVER PO) Take 1 tablet by mouth daily.    °• nitroGLYCERIN (NITROSTAT) 0.4 MG SL tablet Place 1 tablet (0.4 mg total) under the tongue every 5 (five) minutes x 3 doses as needed for chest pain. 60 tablet 3  °• NUCYNTA 100 MG TABS Take 1 tablet by mouth 4 (four) times daily.    ° °No current facility-administered medications for this visit.  ° ° °REVIEW OF SYSTEMS:  ° °Please see the history of present illness.    ° °All other systems reviewed and are negative. ° °PHYSICAL EXAM:  ° °No physical exam was performed as this was a telephone visit ° ° °Recent Labs: °No results found for requested labs within last 8760 hours.  ° °Recent Lipid Panel °Lab Results  °Component Value Date/Time  ° CHOL 194 05/21/2012 04:26 AM  ° TRIG 84 05/21/2012 04:26 AM  ° HDL 49 05/21/2012 04:26 AM  ° CHOLHDL 4.0 05/21/2012 04:26 AM  ° LDLCALC 128 (H) 05/21/2012 04:26 AM  ° ° °Wt Readings from Last 3 Encounters:  °06/27/20 184 lb (83.5 kg)  °04/22/20 184 lb 9.6 oz (83.7 kg)  °05/10/18 179 lb (81.2 kg)  °  ° °STUDIES:  ° °I have reviewed his CT scan with the following findings: °  °Patient has a history of tobacco abuse in the past.  He has a history of premature  cardiovascular disease in his father who had an MI at 56.  The patient has a history of CABG in 2017.  He is medically managed for hypertension with an ACE inhibitor.  He takes a statin for hypercholesterolemia.  He   is on dual antiplatelet therapy with aspirin Plavix. °ASSESSMENT and PLAN  ° °Symptomatic left carotid stenosis: I discussed with the patient that the radiology interpretation of his CT scan is that there is a short 1 cm segment occlusion in the left internal carotid artery.  Upon my review of the scan I question whether or not there is a string sign.  I discussed with the patient if his carotid has a high-grade stenosis that he would benefit from revascularization.  If it is occluded, he would need medical management.  The carotid bifurcation is at the level of C1 so I do not think that he is a candidate for open endarterectomy because of his anatomy.  I think he would best be served with TCAR.  Before proceeding with TCAR, I would like to confirm whether or not his carotid is patent.  I discussed proceeding with a diagnostic arteriogram via a femoral approach.  If this confirms a patent carotid system on the left I would proceed with TCAR.  I explained the details of the procedure with the patient he wishes to proceed.  I will get him scheduled for next week.  He will continue his aspirin statin and Plavix. ° ° ° ° °Time:   °Today, I have spent 15 minutes with the patient with telehealth technology discussing the above problems.   ° ° ° °Wells Sunaina Ferrando, IV, MD, FACS °[facility] [place] °Tel [phone] °Pager [phone] ° °

## 2020-07-07 NOTE — Progress Notes (Signed)
[facility] [place]  Patient name: Joe Walls MRN: [id number] DOB: 01-Jun-1951 Sex: male      Virtual Visit via Telephone Note   This visit type was conducted due to national recommendations for restrictions regarding the COVID-19 Pandemic (e.g. social distancing) in an effort to limit this patient's exposure and mitigate transmission in our community.  Due to his co-morbid illnesses, this patient is at least at moderate risk for complications without adequate follow up.  This format is felt to be most appropriate for this patient at this time.  The patient did not have access to video technology/had technical difficulties with video requiring transitioning to audio format only (telephone).  All issues noted in this document were discussed and addressed.  No physical exam could be performed with this format.    Patient Location: Home Provider Location: Office/Clinic    REASON FOR APPOINTMENT:    Follow up  HISTORY OF PRESENT ILLNESS:   Joe Walls is a 69 y.o. male, who I saw on 06/25/2020 for evaluation of symptomatic carotid stenosis at the request of Dr. Allyson Sabal.  In April he had some right-sided paresthesias.  He was supposed to get a CT angiogram however this did not occur.  I ordered a CT scan for further evaluation because his carotid duplex was technically challenging.  I called him today to discuss the results.  He reports no new symptoms.   Patient has a history of tobacco abuse in the past.  He has a history of premature cardiovascular disease in his father who had an MI at 17.  The patient has a history of CABG in 2017.  He is medically managed for hypertension with an ACE inhibitor.  He takes a statin for hypercholesterolemia.  He is on dual antiplatelet therapy with aspirin Plavix.      PAST MEDICAL HISTORY    Past Medical History:  Diagnosis Date   CAD (coronary artery disease)    High cholesterol    Hypertension      Renal disorder      FAMILY HISTORY   Family History  Problem Relation Age of Onset   Coronary artery disease Father 69       died of an MI    SOCIAL HISTORY:   Social History   Socioeconomic History   Marital status: Married    Spouse name: Not on file   Number of children: Not on file   Years of education: Not on file   Highest education level: Not on file  Occupational History   Occupation: retired    Comment: Paramedic  Tobacco Use   Smoking status: Former Smoker    Quit date: 05/21/1992    Years since quitting: 28.1   Smokeless tobacco: Never Used  Substance and Sexual Activity   Alcohol use: Yes    Comment: beer every other day   Drug use: No   Sexual activity: Not on file  Other Topics Concern   Not on file  Social History Narrative   Not on file   Social Determinants of Health   Financial Resource Strain:    Difficulty of Paying Living Expenses:   Food Insecurity:    Worried About Programme researcher, broadcasting/film/video in the Last Year:    Barista in the Last Year:   Transportation Needs:    Freight forwarder (Medical):    Lack of Transportation (  Non-Medical):   Physical Activity:    Days of Exercise per Week:    Minutes of Exercise per Session:   Stress:    Feeling of Stress :   Social Connections:    Frequency of Communication with Friends and Family:    Frequency of Social Gatherings with Friends and Family:    Attends Religious Services:    Active Member of Clubs or Organizations:    Attends Banker Meetings:    Marital Status:   Intimate Partner Violence:    Fear of Current or Ex-Partner:    Emotionally Abused:    Physically Abused:    Sexually Abused:     ALLERGIES:    No Known Allergies  CURRENT MEDICATIONS:    Current Outpatient Medications  Medication Sig Dispense Refill   amLODipine (NORVASC) 10 MG tablet Take 10 mg by mouth daily.     aspirin EC 81 MG tablet Take 81 mg by  mouth daily.     atorvastatin (LIPITOR) 10 MG tablet Take 10 mg by mouth daily.     carvedilol (COREG) 3.125 MG tablet Take 3.125 mg by mouth 2 (two) times daily with a meal.     clopidogrel (PLAVIX) 75 MG tablet Take by mouth.     Ergocalciferol (VITAMIN D2 PO) Take 1.25 mg by mouth.     FLUoxetine (PROZAC) 20 MG capsule Take 20 mg by mouth daily.     gabapentin (NEURONTIN) 300 MG capsule Take 300 mg by mouth daily.     HYDROcodone-acetaminophen (LORCET) 10-650 MG per tablet Take 1 tablet by mouth every 6 (six) hours as needed. (Patient not taking: Reported on 06/27/2020)     levothyroxine (SYNTHROID) 100 MCG tablet Take 100 mcg by mouth daily before breakfast.     lisinopril (ZESTRIL) 5 MG tablet Take 5 mg by mouth daily.     Multiple Vitamins-Minerals (CENTRUM SILVER PO) Take 1 tablet by mouth daily.     nitroGLYCERIN (NITROSTAT) 0.4 MG SL tablet Place 1 tablet (0.4 mg total) under the tongue every 5 (five) minutes x 3 doses as needed for chest pain. 60 tablet 3   NUCYNTA 100 MG TABS Take 1 tablet by mouth 4 (four) times daily.     No current facility-administered medications for this visit.    REVIEW OF SYSTEMS:   Please see the history of present illness.     All other systems reviewed and are negative.  PHYSICAL EXAM:   No physical exam was performed as this was a telephone visit   Recent Labs: No results found for requested labs within last 8760 hours.   Recent Lipid Panel Lab Results  Component Value Date/Time   CHOL 194 05/21/2012 04:26 AM   TRIG 84 05/21/2012 04:26 AM   HDL 49 05/21/2012 04:26 AM   CHOLHDL 4.0 05/21/2012 04:26 AM   LDLCALC 128 (H) 05/21/2012 04:26 AM    Wt Readings from Last 3 Encounters:  06/27/20 184 lb (83.5 kg)  04/22/20 184 lb 9.6 oz (83.7 kg)  05/10/18 179 lb (81.2 kg)     STUDIES:   I have reviewed his CT scan with the following findings:  Patient has a history of tobacco abuse in the past.  He has a history of premature  cardiovascular disease in his father who had an MI at 69.  The patient has a history of CABG in 2017.  He is medically managed for hypertension with an ACE inhibitor.  He takes a statin for hypercholesterolemia.  He  is on dual antiplatelet therapy with aspirin Plavix. ASSESSMENT and PLAN   Symptomatic left carotid stenosis: I discussed with the patient that the radiology interpretation of his CT scan is that there is a short 1 cm segment occlusion in the left internal carotid artery.  Upon my review of the scan I question whether or not there is a string sign.  I discussed with the patient if his carotid has a high-grade stenosis that he would benefit from revascularization.  If it is occluded, he would need medical management.  The carotid bifurcation is at the level of C1 so I do not think that he is a candidate for open endarterectomy because of his anatomy.  I think he would best be served with TCAR.  Before proceeding with TCAR, I would like to confirm whether or not his carotid is patent.  I discussed proceeding with a diagnostic arteriogram via a femoral approach.  If this confirms a patent carotid system on the left I would proceed with TCAR.  I explained the details of the procedure with the patient he wishes to proceed.  I will get him scheduled for next week.  He will continue his aspirin statin and Plavix.     Time:   Today, I have spent 15 minutes with the patient with telehealth technology discussing the above problems.      Charlena Cross, MD, FACS [facility] [phone] Pager [phone]

## 2020-07-08 ENCOUNTER — Other Ambulatory Visit: Payer: Self-pay

## 2020-07-12 NOTE — Progress Notes (Signed)
[id number] - [place], [place] - [address] [place] [address] Phone: [phone] Fax: [phone]      Your procedure is scheduled on Thursday 07/14/2020.  Report to [facility] Main Entrance "A" at 07:45 A.M., and check in at the Admitting office.  Call this number if you have problems the morning of surgery:  [phone]  Call [phone] if you have any questions prior to your surgery date Monday-Friday 8am-4pm    Remember:  Do not eat or drink after midnight the night before your surgery     Take these medicines the morning of surgery with A SIP OF WATER: Amlodipine (Norvasc) Atorvastatin (Lipitor) Carvedilol (Coreg) Nucynta - if needed  Per Dr. Myra Gianotti, continue your Aspirin and Plavix (Clopidogrel).  Do not take the morning of surgery.    As of today, STOP taking any Aleve, Naproxen, Ibuprofen, Motrin, Advil, Goody's, BC's, all herbal medications, fish oil, and all vitamins.                      Do not wear jewelry, make up, or nail polish            Do not wear lotions, powders, colognes, or deodorant.            Men may shave face and neck.            Do not bring valuables to the hospital.            [facility] is not responsible for any belongings or valuables.  Do NOT Smoke (Tobacco/Vaping) or drink Alcohol 24 hours prior to your procedure  If you use a CPAP at night, you may bring all equipment for your overnight stay.   Contacts, glasses, dentures or bridgework may not be worn into surgery.      For patients admitted to the hospital, discharge time will be determined by your treatment team.   Patients discharged the day of surgery will not be allowed to drive home, and someone needs to stay with them for 24 hours.    Special instructions:   [place]- Preparing For Surgery  Before surgery, you can play an important role. Because skin is not sterile, your skin needs to be as free of  germs as possible. You can reduce the number of germs on your skin by washing with CHG (chlorahexidine gluconate) Soap before surgery.  CHG is an antiseptic cleaner which kills germs and bonds with the skin to continue killing germs even after washing.    Oral Hygiene is also important to reduce your risk of infection.  Remember - BRUSH YOUR TEETH THE MORNING OF SURGERY WITH YOUR REGULAR TOOTHPASTE  Please do not use if you have an allergy to CHG or antibacterial soaps. If your skin becomes reddened/irritated stop using the CHG.  Do not shave (including legs and underarms) for at least 48 hours prior to first CHG shower. It is OK to shave your face.  Please follow these instructions carefully.   1. Shower the NIGHT BEFORE SURGERY and the MORNING OF SURGERY with CHG Soap.   2. If you chose to wash your hair, wash your hair first as usual with your normal shampoo.  3. After you shampoo, rinse your hair and body thoroughly to remove the shampoo.  4. Use CHG as you would any other liquid soap. You can apply CHG directly to the skin and wash gently with  a scrungie or a clean washcloth.   5. Apply the CHG Soap to your body ONLY FROM THE NECK DOWN.  Do not use on open wounds or open sores. Avoid contact with your eyes, ears, mouth and genitals (private parts). Wash Face and genitals (private parts)  with your normal soap.   6. Wash thoroughly, paying special attention to the area where your surgery will be performed.  7. Thoroughly rinse your body with warm water from the neck down.  8. DO NOT shower/wash with your normal soap after using and rinsing off the CHG Soap.  9. Pat yourself dry with a CLEAN TOWEL.  10. Wear CLEAN PAJAMAS to bed the night before surgery  11. Place CLEAN SHEETS on your bed the night of your first shower and DO NOT SLEEP WITH PETS.   Day of Surgery: Wear Clean/Comfortable clothing the morning of surgery Do not apply any deodorants/lotions.   Remember to brush your  teeth WITH YOUR REGULAR TOOTHPASTE.   Please read over the following fact sheets that you were given.

## 2020-07-13 ENCOUNTER — Other Ambulatory Visit: Payer: Self-pay

## 2020-07-13 ENCOUNTER — Encounter (HOSPITAL_COMMUNITY)
Admission: RE | Admit: 2020-07-13 | Discharge: 2020-07-13 | Disposition: A | Discharge status: Home / Self Care | Payer: HMO | Source: Ambulatory Visit | Attending: Surgery | Admitting: Surgery

## 2020-07-13 ENCOUNTER — Other Ambulatory Visit (HOSPITAL_COMMUNITY)
Admission: RE | Admit: 2020-07-13 | Discharge: 2020-07-13 | Disposition: A | Discharge status: Home / Self Care | Payer: HMO | Source: Ambulatory Visit | Attending: Surgery | Admitting: Surgery

## 2020-07-13 ENCOUNTER — Encounter (HOSPITAL_COMMUNITY): Payer: Self-pay

## 2020-07-13 DIAGNOSIS — I2510 Atherosclerotic heart disease of native coronary artery without angina pectoris: Secondary | ICD-10-CM | POA: Diagnosis present

## 2020-07-13 DIAGNOSIS — F4310 Post-traumatic stress disorder, unspecified: Secondary | ICD-10-CM | POA: Diagnosis present

## 2020-07-13 DIAGNOSIS — N390 Urinary tract infection, site not specified: Secondary | ICD-10-CM

## 2020-07-13 DIAGNOSIS — E785 Hyperlipidemia, unspecified: Secondary | ICD-10-CM | POA: Diagnosis present

## 2020-07-13 DIAGNOSIS — Z951 Presence of aortocoronary bypass graft: Secondary | ICD-10-CM | POA: Diagnosis not present

## 2020-07-13 DIAGNOSIS — Z79899 Other long term (current) drug therapy: Secondary | ICD-10-CM | POA: Diagnosis not present

## 2020-07-13 DIAGNOSIS — I6522 Occlusion and stenosis of left carotid artery: Secondary | ICD-10-CM | POA: Diagnosis present

## 2020-07-13 DIAGNOSIS — Z79890 Hormone replacement therapy (postmenopausal): Secondary | ICD-10-CM | POA: Diagnosis not present

## 2020-07-13 DIAGNOSIS — Z7902 Long term (current) use of antithrombotics/antiplatelets: Secondary | ICD-10-CM | POA: Diagnosis not present

## 2020-07-13 DIAGNOSIS — Z87891 Personal history of nicotine dependence: Secondary | ICD-10-CM | POA: Diagnosis not present

## 2020-07-13 DIAGNOSIS — Z8249 Family history of ischemic heart disease and other diseases of the circulatory system: Secondary | ICD-10-CM | POA: Diagnosis not present

## 2020-07-13 DIAGNOSIS — E7800 Pure hypercholesterolemia, unspecified: Secondary | ICD-10-CM | POA: Diagnosis present

## 2020-07-13 DIAGNOSIS — Z7982 Long term (current) use of aspirin: Secondary | ICD-10-CM | POA: Diagnosis not present

## 2020-07-13 DIAGNOSIS — Z20822 Contact with and (suspected) exposure to covid-19: Secondary | ICD-10-CM | POA: Diagnosis present

## 2020-07-13 DIAGNOSIS — I10 Essential (primary) hypertension: Secondary | ICD-10-CM | POA: Diagnosis present

## 2020-07-13 LAB — COMPREHENSIVE METABOLIC PANEL
ALT: 19 U/L (ref 0–44)
AST: 26 U/L (ref 15–41)
Albumin: 4 g/dL (ref 3.5–5.0)
Alkaline Phosphatase: 101 U/L (ref 38–126)
Anion gap: 12 (ref 5–15)
BUN: 13 mg/dL (ref 8–23)
CO2: 25 mmol/L (ref 22–32)
Calcium: 9.5 mg/dL (ref 8.9–10.3)
Chloride: 103 mmol/L (ref 98–111)
Creatinine, Ser: 1.21 mg/dL (ref 0.61–1.24)
GFR calc Af Amer: >60 mL/min (ref >60)
GFR calc non Af Amer: >60 mL/min (ref >60)
Glucose, Bld: 87 mg/dL (ref 70–99)
Potassium: 3.8 mmol/L (ref 3.5–5.1)
Sodium: 140 mmol/L (ref 135–145)
Total Bilirubin: 0.9 mg/dL (ref 0.3–1.2)
Total Protein: 7.1 g/dL (ref 6.5–8.1)

## 2020-07-13 LAB — CBC
HCT: 41.7 % (ref 39.0–52.0)
Hemoglobin: 14 g/dL (ref 13.0–17.0)
MCH: 32.3 pg (ref 26.0–34.0)
MCHC: 33.6 g/dL (ref 30.0–36.0)
MCV: 96.1 fL (ref 80.0–100.0)
Platelets: 186 10*3/uL (ref 150–400)
RBC: 4.34 MIL/uL (ref 4.22–5.81)
RDW: 12.3 % (ref 11.5–15.5)
WBC: 5.6 10*3/uL (ref 4.0–10.5)
nRBC: 0 % (ref 0.0–0.2)

## 2020-07-13 LAB — URINALYSIS, MICROSCOPIC (REFLEX)
RBC / HPF: NONE SEEN RBC/hpf (ref 0–5)
Squamous Epithelial / HPF: NONE SEEN (ref 0–5)

## 2020-07-13 LAB — URINALYSIS, ROUTINE W REFLEX MICROSCOPIC
Bilirubin Urine: NEGATIVE
Glucose, UA: NEGATIVE mg/dL
Hgb urine dipstick: NEGATIVE
Ketones, ur: NEGATIVE mg/dL
Leukocytes,Ua: NEGATIVE
Nitrite: POSITIVE — AB
Protein, ur: NEGATIVE mg/dL
Specific Gravity, Urine: 1.025 (ref 1.005–1.030)
pH: 5.5 (ref 5.0–8.0)

## 2020-07-13 LAB — TYPE AND SCREEN
ABO/RH(D): O POS
Antibody Screen: NEGATIVE

## 2020-07-13 LAB — SURGICAL PCR SCREEN
MRSA, PCR: NEGATIVE
Staphylococcus aureus: NEGATIVE

## 2020-07-13 LAB — APTT: aPTT: 30 seconds (ref 24–36)

## 2020-07-13 LAB — PROTIME-INR
INR: 1 (ref 0.8–1.2)
Prothrombin Time: 13.1 seconds (ref 11.4–15.2)

## 2020-07-13 LAB — SARS CORONAVIRUS 2 (TAT 6-24 HRS): SARS Coronavirus 2: NEGATIVE

## 2020-07-13 MED ORDER — SULFAMETHOXAZOLE-TRIMETHOPRIM 400-80 MG PO TABS: 2.0000 | ORAL_TABLET | Freq: Two times a day (BID) | ORAL | 0 refills | Status: DC

## 2020-07-13 NOTE — Progress Notes (Signed)
PCP - Dr. Wynelle Link Cardiologist - [facility]    Chest x-ray - N/A EKG - 05/25/20- C.E- tracing requested  Stress Test - 05/25/20 ECHO - 05/25/20 Cardiac Cath - 07/16/16  Sleep Study - h/o OSA CPAP - uses QHS, does not know pressure settings   Blood Thinner Instructions: per Dr. Myra Gianotti, continue Plavix and ASA Aspirin Instructions: Patient instructed to hold all NSAID's, herbal medications, fish oil and vitamins 7 days prior to surgery.    COVID TEST- 07/14/20 at [facility]. Pt instructed to remain in their car. Educated on Haematologist until [employer]    Anesthesia review: cardiac history  Patient denies shortness of breath, fever, cough and chest pain at PAT appointment   All instructions explained to the patient, with a verbal understanding of the material. Patient agrees to go over the instructions while at home for a better understanding. Patient also instructed to self quarantine after being tested for COVID-19. The opportunity to ask questions was provided.

## 2020-07-13 NOTE — Progress Notes (Signed)
Pt UA + for nitrites and bacteria. Spoke with Morrie Sheldon at Dr. Eddie North office- aware of result.

## 2020-07-13 NOTE — Anesthesia Preprocedure Evaluation (Addendum)
Anesthesia Evaluation    Reviewed: Allergy & Precautions, Patient's Chart, lab work & pertinent test results, Unable to perform ROS - Chart review only  Airway Mallampati: II  TM Distance: >3 FB Neck ROM: Full    Dental  (+) Missing, Dental Advisory Given,    Pulmonary neg pulmonary ROS, former smoker,    Pulmonary exam normal breath sounds clear to auscultation       Cardiovascular hypertension, Pt. on medications + CAD  Normal cardiovascular exam Rhythm:Regular Rate:Normal     Neuro/Psych negative neurological ROS  negative psych ROS   GI/Hepatic negative GI ROS, Neg liver ROS,   Endo/Other  negative endocrine ROS  Renal/GU Renal disease     Musculoskeletal negative musculoskeletal ROS (+)   Abdominal   Peds  Hematology negative hematology ROS (+)   Anesthesia Other Findings   Reproductive/Obstetrics negative OB ROS                          Anesthesia Physical Anesthesia Plan  ASA: III  Anesthesia Plan: General   Post-op Pain Management:    Induction: Intravenous  PONV Risk Score and Plan: 3 and Ondansetron, Dexamethasone and Treatment may vary due to age or medical condition  Airway Management Planned: Oral ETT  Additional Equipment: Arterial line  Intra-op Plan:   Post-operative Plan: Extubation in OR  Informed Consent: I have reviewed the patients History and Physical, chart, labs and discussed the procedure including the risks, benefits and alternatives for the proposed anesthesia with the patient or authorized representative who has indicated his/her understanding and acceptance.     Dental advisory given  Plan Discussed with: CRNA  Anesthesia Plan Comments: (2 x PIV, remi gtt    PAT note by Antionette Poles, PA-C: Follows with cardiologist Dr. Hanley Hays at [facility] for history of CAD status post CABG x 4 in 2017.  Recent nuclear stress 05/25/2020 was low risk,  nonischemic.  Recent echo showed normal LVEF 65-70%.  Patient recently referred to Dr. Allyson Sabal by Dr. Hanley Hays for evaluation of symptomatic carotid stenosis.  Subsequently referred to vascular surgery for management.  Preop labs reviewed, WNL.  EKG 05/25/2020 (copy on chart): Sinus rhythm.  Rate 61.  Possible LAE.  Incomplete left bundle branch block.  Nuclear stress 05/25/2020 (copy on chart): Impression: 1.  No reversible ischemia or infarction. 2.  Normal left ventricular wall motion. 3.  Left ventricular ejection fraction 67%. 4.  Noninvasive stratification: Low.  TTE 05/25/2020 (copy on chart): Summary: 1.  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 2.  The left ventricular wall motion is normal. 3.  Left ventricular systolic function is normal. 4.  LV ejection fraction = 65-70%. 5.  Left atrium is mildly dilated. 6.  The mitral valve leaflets appear normal with mild mitral regurgitation. 7.  RVSP not able to be calculated. )     Anesthesia Quick Evaluation

## 2020-07-13 NOTE — Progress Notes (Signed)
Anesthesia Chart Review:  Follows with cardiologist Dr. Hanley Hays at [facility] for history of CAD status post CABG x 4 in 2017.  Recent nuclear stress 05/25/2020 was low risk, nonischemic.  Recent echo showed normal LVEF 65-70%.  Patient recently referred to Dr. Allyson Sabal by Dr. Hanley Hays for evaluation of symptomatic carotid stenosis.  Subsequently referred to vascular surgery for management.  Preop labs reviewed, WNL.  EKG 05/25/2020 (copy on chart): Sinus rhythm.  Rate 61.  Possible LAE.  Incomplete left bundle branch block.  Nuclear stress 05/25/2020 (copy on chart): Impression: 1.  No reversible ischemia or infarction. 2.  Normal left ventricular wall motion. 3.  Left ventricular ejection fraction 67%. 4.  Noninvasive stratification: Low.  TTE 05/25/2020 (copy on chart): Summary: 1.  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 2.  The left ventricular wall motion is normal. 3.  Left ventricular systolic function is normal. 4.  LV ejection fraction = 65-70%. 5.  Left atrium is mildly dilated. 6.  The mitral valve leaflets appear normal with mild mitral regurgitation. 7.  RVSP not able to be calculated.  Joe Walls [facility] Short Stay Center/Anesthesiology Phone [phone] 07/13/2020 2:24 PM

## 2020-07-15 ENCOUNTER — Inpatient Hospital Stay (HOSPITAL_COMMUNITY)
Admission: RE | Admit: 2020-07-15 | Discharge: 2020-07-16 | DRG: 068 | Disposition: A | Discharge status: Home / Self Care | Payer: HMO | Attending: Surgery | Admitting: Surgery

## 2020-07-15 ENCOUNTER — Encounter (HOSPITAL_COMMUNITY)
Admission: RE | Disposition: A | Discharge status: Home / Self Care | Payer: Self-pay | Source: Home / Self Care | Attending: Surgery

## 2020-07-15 ENCOUNTER — Other Ambulatory Visit: Payer: Self-pay

## 2020-07-15 ENCOUNTER — Inpatient Hospital Stay (HOSPITAL_COMMUNITY): Payer: HMO | Admitting: Physician Assistant

## 2020-07-15 ENCOUNTER — Inpatient Hospital Stay (HOSPITAL_COMMUNITY): Payer: HMO | Admitting: Anesthesiology

## 2020-07-15 ENCOUNTER — Encounter (HOSPITAL_COMMUNITY): Payer: Self-pay | Admitting: Surgery

## 2020-07-15 ENCOUNTER — Inpatient Hospital Stay (HOSPITAL_COMMUNITY): Payer: HMO

## 2020-07-15 ENCOUNTER — Ambulatory Visit: Payer: HMO | Admitting: Surgery

## 2020-07-15 DIAGNOSIS — Z7982 Long term (current) use of aspirin: Secondary | ICD-10-CM

## 2020-07-15 DIAGNOSIS — E7800 Pure hypercholesterolemia, unspecified: Secondary | ICD-10-CM | POA: Diagnosis present

## 2020-07-15 DIAGNOSIS — Z87891 Personal history of nicotine dependence: Secondary | ICD-10-CM

## 2020-07-15 DIAGNOSIS — Z8249 Family history of ischemic heart disease and other diseases of the circulatory system: Secondary | ICD-10-CM

## 2020-07-15 DIAGNOSIS — F4310 Post-traumatic stress disorder, unspecified: Secondary | ICD-10-CM | POA: Diagnosis present

## 2020-07-15 DIAGNOSIS — E785 Hyperlipidemia, unspecified: Secondary | ICD-10-CM | POA: Diagnosis present

## 2020-07-15 DIAGNOSIS — Z7902 Long term (current) use of antithrombotics/antiplatelets: Secondary | ICD-10-CM

## 2020-07-15 DIAGNOSIS — Z79890 Hormone replacement therapy (postmenopausal): Secondary | ICD-10-CM

## 2020-07-15 DIAGNOSIS — I10 Essential (primary) hypertension: Secondary | ICD-10-CM | POA: Diagnosis present

## 2020-07-15 DIAGNOSIS — Z951 Presence of aortocoronary bypass graft: Secondary | ICD-10-CM

## 2020-07-15 DIAGNOSIS — I6529 Occlusion and stenosis of unspecified carotid artery: Secondary | ICD-10-CM | POA: Diagnosis present

## 2020-07-15 DIAGNOSIS — Z79899 Other long term (current) drug therapy: Secondary | ICD-10-CM

## 2020-07-15 DIAGNOSIS — I2510 Atherosclerotic heart disease of native coronary artery without angina pectoris: Secondary | ICD-10-CM | POA: Diagnosis present

## 2020-07-15 DIAGNOSIS — Z20822 Contact with and (suspected) exposure to covid-19: Secondary | ICD-10-CM | POA: Diagnosis present

## 2020-07-15 DIAGNOSIS — I6522 Occlusion and stenosis of left carotid artery: Secondary | ICD-10-CM | POA: Diagnosis not present

## 2020-07-15 HISTORY — PX: ULTRASOUND GUIDANCE FOR VASCULAR ACCESS: SHX6516

## 2020-07-15 HISTORY — PX: TRANSCAROTID ARTERY REVASCULARIZATIONÂ: SHX6778

## 2020-07-15 HISTORY — PX: UPPER EXTREMITY ANGIOGRAM: SHX6310

## 2020-07-15 LAB — URINALYSIS, ROUTINE W REFLEX MICROSCOPIC
Bilirubin Urine: NEGATIVE
Glucose, UA: NEGATIVE mg/dL
Hgb urine dipstick: NEGATIVE
Ketones, ur: NEGATIVE mg/dL
Leukocytes,Ua: NEGATIVE
Nitrite: NEGATIVE
Protein, ur: NEGATIVE mg/dL
Specific Gravity, Urine: 1.019 (ref 1.005–1.030)
pH: 6 (ref 5.0–8.0)

## 2020-07-15 LAB — POCT ACTIVATED CLOTTING TIME
Activated Clotting Time: 197 seconds
Activated Clotting Time: 290 seconds
Activated Clotting Time: 340 seconds

## 2020-07-15 LAB — ABO/RH: ABO/RH(D): O POS

## 2020-07-15 SURGERY — TRANSCAROTID ARTERY REVASCULARIZATION (TCAR)
Anesthesia: General | Site: Neck | Laterality: Left

## 2020-07-15 MED ORDER — ROCURONIUM BROMIDE 10 MG/ML (PF) SYRINGE
PREFILLED_SYRINGE | INTRAVENOUS | Status: AC
  Filled 2020-07-15: qty 30

## 2020-07-15 MED ORDER — EPHEDRINE SULFATE-NACL 50-0.9 MG/10ML-% IV SOSY
PREFILLED_SYRINGE | INTRAVENOUS | Status: DC | PRN
  Administered 2020-07-15: 10 mg via INTRAVENOUS
  Administered 2020-07-15 (×2): 2.5 mg via INTRAVENOUS
  Administered 2020-07-15 (×5): 5 mg via INTRAVENOUS

## 2020-07-15 MED ORDER — ONDANSETRON HCL 4 MG/2ML IJ SOLN
INTRAMUSCULAR | Status: AC
  Filled 2020-07-15: qty 6

## 2020-07-15 MED ORDER — ACETAMINOPHEN 650 MG RE SUPP: 325.0000 mg | RECTAL | Status: DC | PRN

## 2020-07-15 MED ORDER — SODIUM CHLORIDE 0.9 % IV SOLN: 500.0000 mL | Freq: Once | INTRAVENOUS | Status: DC | PRN

## 2020-07-15 MED ORDER — ROCURONIUM BROMIDE 10 MG/ML (PF) SYRINGE
PREFILLED_SYRINGE | INTRAVENOUS | Status: DC | PRN
  Administered 2020-07-15: 20 mg via INTRAVENOUS
  Administered 2020-07-15: 30 mg via INTRAVENOUS
  Administered 2020-07-15: 50 mg via INTRAVENOUS
  Administered 2020-07-15: 20 mg via INTRAVENOUS

## 2020-07-15 MED ORDER — HEMOSTATIC AGENTS (NO CHARGE) OPTIME
TOPICAL | Status: DC | PRN
  Administered 2020-07-15: 1 via TOPICAL

## 2020-07-15 MED ORDER — FENTANYL CITRATE (PF) 100 MCG/2ML IJ SOLN
INTRAMUSCULAR | Status: AC
  Filled 2020-07-15: qty 2

## 2020-07-15 MED ORDER — SUCCINYLCHOLINE CHLORIDE 200 MG/10ML IV SOSY
PREFILLED_SYRINGE | INTRAVENOUS | Status: AC
  Filled 2020-07-15: qty 10

## 2020-07-15 MED ORDER — OXYCODONE HCL 5 MG PO TABS
10.0000 mg | ORAL_TABLET | ORAL | Status: DC | PRN
  Administered 2020-07-16: 10 mg via ORAL
  Filled 2020-07-15: qty 2

## 2020-07-15 MED ORDER — HEPARIN SODIUM (PORCINE) 1000 UNIT/ML IJ SOLN
INTRAMUSCULAR | Status: AC
  Filled 2020-07-15: qty 2

## 2020-07-15 MED ORDER — HYDRALAZINE HCL 20 MG/ML IJ SOLN: 5.0000 mg | INTRAMUSCULAR | Status: DC | PRN

## 2020-07-15 MED ORDER — LABETALOL HCL 5 MG/ML IV SOLN
INTRAVENOUS | Status: DC | PRN
  Administered 2020-07-15: 15 mg via INTRAVENOUS
  Administered 2020-07-15: 5 mg via INTRAVENOUS

## 2020-07-15 MED ORDER — VASOPRESSIN 20 UNIT/ML IV SOLN
INTRAVENOUS | Status: AC
  Filled 2020-07-15: qty 1

## 2020-07-15 MED ORDER — CARVEDILOL 3.125 MG PO TABS: 3.1250 mg | ORAL_TABLET | Freq: Two times a day (BID) | ORAL | Status: DC

## 2020-07-15 MED ORDER — AMLODIPINE BESYLATE 10 MG PO TABS: 10.0000 mg | ORAL_TABLET | Freq: Every day | ORAL | Status: DC

## 2020-07-15 MED ORDER — ACETAMINOPHEN 325 MG PO TABS: 325.0000 mg | ORAL_TABLET | ORAL | Status: DC | PRN

## 2020-07-15 MED ORDER — MORPHINE SULFATE (PF) 2 MG/ML IV SOLN
2.0000 mg | INTRAVENOUS | Status: DC | PRN
  Administered 2020-07-15: 2 mg via INTRAVENOUS
  Filled 2020-07-15: qty 1

## 2020-07-15 MED ORDER — ATORVASTATIN CALCIUM 40 MG PO TABS
40.0000 mg | ORAL_TABLET | Freq: Every day | ORAL | Status: DC
  Administered 2020-07-16: 40 mg via ORAL
  Filled 2020-07-15: qty 1

## 2020-07-15 MED ORDER — LEVOTHYROXINE SODIUM 100 MCG PO TABS
100.0000 ug | ORAL_TABLET | Freq: Every day | ORAL | Status: DC
  Administered 2020-07-15: 100 ug via ORAL
  Filled 2020-07-15: qty 1

## 2020-07-15 MED ORDER — DOCUSATE SODIUM 100 MG PO CAPS
100.0000 mg | ORAL_CAPSULE | Freq: Every day | ORAL | Status: DC
  Administered 2020-07-16: 100 mg via ORAL
  Filled 2020-07-15: qty 1

## 2020-07-15 MED ORDER — POLYETHYLENE GLYCOL 3350 17 G PO PACK: 17.0000 g | PACK | Freq: Every day | ORAL | Status: DC | PRN

## 2020-07-15 MED ORDER — LIDOCAINE 2% (20 MG/ML) 5 ML SYRINGE
INTRAMUSCULAR | Status: DC | PRN
  Administered 2020-07-15: 100 mg via INTRAVENOUS

## 2020-07-15 MED ORDER — SODIUM CHLORIDE 0.9 % IV SOLN
INTRAVENOUS | Status: AC
  Filled 2020-07-15: qty 1.2

## 2020-07-15 MED ORDER — FENTANYL CITRATE (PF) 100 MCG/2ML IJ SOLN
25.0000 ug | INTRAMUSCULAR | Status: DC | PRN
  Administered 2020-07-15 (×2): 25 ug via INTRAVENOUS
  Administered 2020-07-15 (×2): 50 ug via INTRAVENOUS

## 2020-07-15 MED ORDER — ORAL CARE MOUTH RINSE: 15.0000 mL | Freq: Once | OROMUCOSAL | Status: AC

## 2020-07-15 MED ORDER — ONDANSETRON HCL 4 MG/2ML IJ SOLN: 4.0000 mg | Freq: Four times a day (QID) | INTRAMUSCULAR | Status: DC | PRN

## 2020-07-15 MED ORDER — CEFAZOLIN SODIUM-DEXTROSE 2-4 GM/100ML-% IV SOLN
2.0000 g | Freq: Three times a day (TID) | INTRAVENOUS | Status: AC
  Administered 2020-07-15 – 2020-07-16 (×2): 2 g via INTRAVENOUS
  Filled 2020-07-15 (×2): qty 100

## 2020-07-15 MED ORDER — SUGAMMADEX SODIUM 200 MG/2ML IV SOLN
INTRAVENOUS | Status: DC | PRN
  Administered 2020-07-15: 200 mg via INTRAVENOUS

## 2020-07-15 MED ORDER — GABAPENTIN 300 MG PO CAPS
600.0000 mg | ORAL_CAPSULE | Freq: Every day | ORAL | Status: DC
  Administered 2020-07-15: 600 mg via ORAL
  Filled 2020-07-15: qty 2

## 2020-07-15 MED ORDER — FENTANYL CITRATE (PF) 250 MCG/5ML IJ SOLN
INTRAMUSCULAR | Status: DC | PRN
  Administered 2020-07-15: 50 ug via INTRAVENOUS
  Administered 2020-07-15: 100 ug via INTRAVENOUS
  Administered 2020-07-15: 50 ug via INTRAVENOUS

## 2020-07-15 MED ORDER — CHLORHEXIDINE GLUCONATE CLOTH 2 % EX PADS: 6.0000 | MEDICATED_PAD | Freq: Once | CUTANEOUS | Status: DC

## 2020-07-15 MED ORDER — IODIXANOL 320 MG/ML IV SOLN
INTRAVENOUS | Status: DC | PRN
  Administered 2020-07-15: 75 mL via INTRA_ARTERIAL

## 2020-07-15 MED ORDER — CEFAZOLIN SODIUM-DEXTROSE 2-4 GM/100ML-% IV SOLN
2.0000 g | INTRAVENOUS | Status: AC
  Administered 2020-07-15: 2 g via INTRAVENOUS
  Filled 2020-07-15: qty 100

## 2020-07-15 MED ORDER — CLOPIDOGREL BISULFATE 75 MG PO TABS
75.0000 mg | ORAL_TABLET | Freq: Every day | ORAL | Status: DC
  Administered 2020-07-15 – 2020-07-16 (×2): 75 mg via ORAL
  Filled 2020-07-15 (×2): qty 1

## 2020-07-15 MED ORDER — HEPARIN SODIUM (PORCINE) 1000 UNIT/ML IJ SOLN
INTRAMUSCULAR | Status: DC | PRN
Start: 2020-07-15 — End: 2020-07-15
  Administered 2020-07-15: 5000 [IU] via INTRAVENOUS
  Administered 2020-07-15: 9000 [IU] via INTRAVENOUS

## 2020-07-15 MED ORDER — ASPIRIN EC 81 MG PO TBEC
81.0000 mg | DELAYED_RELEASE_TABLET | Freq: Every day | ORAL | Status: DC
  Administered 2020-07-15 – 2020-07-16 (×2): 81 mg via ORAL
  Filled 2020-07-15 (×2): qty 1

## 2020-07-15 MED ORDER — CHLORHEXIDINE GLUCONATE 0.12 % MT SOLN
15.0000 mL | Freq: Once | OROMUCOSAL | Status: AC
  Administered 2020-07-15: 15 mL via OROMUCOSAL

## 2020-07-15 MED ORDER — PROTAMINE SULFATE 10 MG/ML IV SOLN
INTRAVENOUS | Status: DC | PRN
  Administered 2020-07-15: 40 mg via INTRAVENOUS
  Administered 2020-07-15: 10 mg via INTRAVENOUS

## 2020-07-15 MED ORDER — ONDANSETRON HCL 4 MG/2ML IJ SOLN: 4.0000 mg | Freq: Once | INTRAMUSCULAR | Status: DC | PRN

## 2020-07-15 MED ORDER — PHENYLEPHRINE HCL-NACL 10-0.9 MG/250ML-% IV SOLN
INTRAVENOUS | Status: DC | PRN
  Administered 2020-07-15: 20 ug/min via INTRAVENOUS

## 2020-07-15 MED ORDER — GLYCOPYRROLATE 0.2 MG/ML IJ SOLN
INTRAMUSCULAR | Status: DC | PRN
  Administered 2020-07-15 (×2): .1 mg via INTRAVENOUS
  Administered 2020-07-15: .2 mg via INTRAVENOUS

## 2020-07-15 MED ORDER — PHENYLEPHRINE HCL (PRESSORS) 10 MG/ML IV SOLN
INTRAVENOUS | Status: DC | PRN
Start: 2020-07-15 — End: 2020-07-15
  Administered 2020-07-15: 40 ug via INTRAVENOUS

## 2020-07-15 MED ORDER — FENTANYL CITRATE (PF) 250 MCG/5ML IJ SOLN
INTRAMUSCULAR | Status: AC
  Filled 2020-07-15: qty 5

## 2020-07-15 MED ORDER — MAGNESIUM SULFATE 2 GM/50ML IV SOLN: 2.0000 g | Freq: Every day | INTRAVENOUS | Status: DC | PRN

## 2020-07-15 MED ORDER — VASOPRESSIN 20 UNIT/ML IV SOLN
INTRAVENOUS | Status: DC | PRN
Start: 2020-07-15 — End: 2020-07-15
  Administered 2020-07-15: 1 [IU] via INTRAVENOUS

## 2020-07-15 MED ORDER — SODIUM CHLORIDE 0.9 % IV SOLN
INTRAVENOUS | Status: DC | PRN
  Administered 2020-07-15: 500 mL

## 2020-07-15 MED ORDER — EPHEDRINE 5 MG/ML INJ
INTRAVENOUS | Status: AC
  Filled 2020-07-15: qty 20

## 2020-07-15 MED ORDER — 0.9 % SODIUM CHLORIDE (POUR BTL) OPTIME
TOPICAL | Status: DC | PRN
  Administered 2020-07-15: 1000 mL

## 2020-07-15 MED ORDER — ONDANSETRON HCL 4 MG/2ML IJ SOLN
INTRAMUSCULAR | Status: DC | PRN
  Administered 2020-07-15: 4 mg via INTRAVENOUS

## 2020-07-15 MED ORDER — LABETALOL HCL 5 MG/ML IV SOLN: 10.0000 mg | INTRAVENOUS | Status: DC | PRN

## 2020-07-15 MED ORDER — ALUM & MAG HYDROXIDE-SIMETH 200-200-20 MG/5ML PO SUSP: 15.0000 mL | ORAL | Status: DC | PRN

## 2020-07-15 MED ORDER — PHENOL 1.4 % MT LIQD: 1.0000 | OROMUCOSAL | Status: DC | PRN

## 2020-07-15 MED ORDER — METOPROLOL TARTRATE 5 MG/5ML IV SOLN: 2.0000 mg | INTRAVENOUS | Status: DC | PRN

## 2020-07-15 MED ORDER — GLYCOPYRROLATE PF 0.2 MG/ML IJ SOSY
PREFILLED_SYRINGE | INTRAMUSCULAR | Status: AC
  Filled 2020-07-15: qty 2

## 2020-07-15 MED ORDER — NITROGLYCERIN 0.4 MG SL SUBL: 0.4000 mg | SUBLINGUAL_TABLET | SUBLINGUAL | Status: DC | PRN

## 2020-07-15 MED ORDER — PRAZOSIN HCL 2 MG PO CAPS
5.0000 mg | ORAL_CAPSULE | Freq: Every day | ORAL | Status: DC
  Administered 2020-07-15: 5 mg via ORAL
  Filled 2020-07-15 (×2): qty 1

## 2020-07-15 MED ORDER — PANTOPRAZOLE SODIUM 40 MG PO TBEC
40.0000 mg | DELAYED_RELEASE_TABLET | Freq: Every day | ORAL | Status: DC
  Administered 2020-07-15 – 2020-07-16 (×2): 40 mg via ORAL
  Filled 2020-07-15 (×2): qty 1

## 2020-07-15 MED ORDER — LIDOCAINE 2% (20 MG/ML) 5 ML SYRINGE
INTRAMUSCULAR | Status: AC
  Filled 2020-07-15: qty 10

## 2020-07-15 MED ORDER — ROCURONIUM BROMIDE 10 MG/ML (PF) SYRINGE
PREFILLED_SYRINGE | INTRAVENOUS | Status: AC
  Filled 2020-07-15: qty 10

## 2020-07-15 MED ORDER — PROPOFOL 1000 MG/100ML IV EMUL
INTRAVENOUS | Status: AC
  Filled 2020-07-15: qty 100

## 2020-07-15 MED ORDER — POTASSIUM CHLORIDE CRYS ER 20 MEQ PO TBCR: 20.0000 meq | EXTENDED_RELEASE_TABLET | Freq: Every day | ORAL | Status: DC | PRN

## 2020-07-15 MED ORDER — PROPOFOL 10 MG/ML IV BOLUS
INTRAVENOUS | Status: DC | PRN
  Administered 2020-07-15: 150 mg via INTRAVENOUS
  Administered 2020-07-15 (×2): 30 mg via INTRAVENOUS
  Administered 2020-07-15: 20 mg via INTRAVENOUS

## 2020-07-15 MED ORDER — DEXAMETHASONE SODIUM PHOSPHATE 10 MG/ML IJ SOLN
INTRAMUSCULAR | Status: DC | PRN
  Administered 2020-07-15: 10 mg via INTRAVENOUS

## 2020-07-15 MED ORDER — GUAIFENESIN-DM 100-10 MG/5ML PO SYRP: 15.0000 mL | ORAL_SOLUTION | ORAL | Status: DC | PRN

## 2020-07-15 MED ORDER — PHENYLEPHRINE 40 MCG/ML (10ML) SYRINGE FOR IV PUSH (FOR BLOOD PRESSURE SUPPORT)
PREFILLED_SYRINGE | INTRAVENOUS | Status: AC
  Filled 2020-07-15: qty 40

## 2020-07-15 MED ORDER — HYDRALAZINE HCL 20 MG/ML IJ SOLN
INTRAMUSCULAR | Status: DC | PRN
Start: 2020-07-15 — End: 2020-07-15
  Administered 2020-07-15: 5 mg via INTRAVENOUS

## 2020-07-15 MED ORDER — DEXAMETHASONE SODIUM PHOSPHATE 10 MG/ML IJ SOLN
INTRAMUSCULAR | Status: AC
  Filled 2020-07-15: qty 3

## 2020-07-15 MED ORDER — LACTATED RINGERS IV SOLN: INTRAVENOUS | Status: DC

## 2020-07-15 MED ORDER — BISACODYL 10 MG RE SUPP: 10.0000 mg | Freq: Every day | RECTAL | Status: DC | PRN

## 2020-07-15 MED ORDER — SODIUM CHLORIDE 0.9 % IV SOLN: INTRAVENOUS | Status: DC

## 2020-07-15 MED ORDER — CHLORHEXIDINE GLUCONATE 0.12 % MT SOLN
OROMUCOSAL | Status: AC
  Filled 2020-07-15: qty 15

## 2020-07-15 SURGICAL SUPPLY — 67 items
ADH SKN CLS APL DERMABOND .7 (GAUZE/BANDAGES/DRESSINGS) ×2
ADPR TBG 2 MALE LL ART (MISCELLANEOUS)
BAG BANDED W/RUBBER/TAPE 36X54 (MISCELLANEOUS) ×4 IMPLANT
BAG EQP BAND 135X91 W/RBR TAPE (MISCELLANEOUS) ×2
BALLN STERLING RX 6X30X80 (BALLOONS) ×4
BALLOON STERLING RX 6X30X80 (BALLOONS) ×2 IMPLANT
CANISTER SUCT 3000ML PPV (MISCELLANEOUS) ×4 IMPLANT
CATH ROBINSON RED A/P 18FR (CATHETERS) IMPLANT
CATH SOFT-VU 4F 65 STRAIGHT (CATHETERS) ×2 IMPLANT
CATH SOFT-VU STRAIGHT 4F 65CM (CATHETERS) ×4
CATH SUCT 10FR WHISTLE TIP (CATHETERS) IMPLANT
CLIP VESOCCLUDE MED 6/CT (CLIP) ×4 IMPLANT
CLIP VESOCCLUDE SM WIDE 6/CT (CLIP) ×4 IMPLANT
CLOSURE MYNX CONTROL 5F (Vascular Products) ×4 IMPLANT
CLOSURE MYNX CONTROL 6F/7F (Vascular Products) IMPLANT
COVER DOME SNAP 22 D (MISCELLANEOUS) ×4 IMPLANT
COVER PROBE W GEL 5X96 (DRAPES) ×4 IMPLANT
COVER WAND RF STERILE (DRAPES) IMPLANT
DERMABOND ADVANCED (GAUZE/BANDAGES/DRESSINGS) ×2
DERMABOND ADVANCED .7 DNX12 (GAUZE/BANDAGES/DRESSINGS) ×2 IMPLANT
DRAPE FEMORAL ANGIO 80X135IN (DRAPES) ×4 IMPLANT
DRAPE INCISE IOBAN 66X45 STRL (DRAPES) ×4 IMPLANT
ELECT REM PT RETURN 9FT ADLT (ELECTROSURGICAL) ×4
ELECTRODE REM PT RTRN 9FT ADLT (ELECTROSURGICAL) ×2 IMPLANT
GLOVE BIOGEL PI IND STRL 7.5 (GLOVE) ×2 IMPLANT
GLOVE BIOGEL PI INDICATOR 7.5 (GLOVE) ×2
GLOVE SURG SS PI 7.5 STRL IVOR (GLOVE) ×4 IMPLANT
GOWN STRL REUS W/ TWL LRG LVL3 (GOWN DISPOSABLE) ×4 IMPLANT
GOWN STRL REUS W/ TWL XL LVL3 (GOWN DISPOSABLE) ×2 IMPLANT
GOWN STRL REUS W/TWL LRG LVL3 (GOWN DISPOSABLE) ×8
GOWN STRL REUS W/TWL XL LVL3 (GOWN DISPOSABLE) ×4
GUIDEWIRE ENROUTE 0.014 (WIRE) ×4 IMPLANT
GUIDEWIRE STR TIP .014X300X8 (WIRE) ×4 IMPLANT
HEMOSTAT SNOW SURGICEL 2X4 (HEMOSTASIS) ×4 IMPLANT
INTRODUCER KIT GALT 7CM (INTRODUCER) ×4
IV ADAPTER SYR DOUBLE MALE LL (MISCELLANEOUS) IMPLANT
KIT BASIN OR (CUSTOM PROCEDURE TRAY) ×4 IMPLANT
KIT ENCORE 26 ADVANTAGE (KITS) ×4 IMPLANT
KIT INTRODUCER GALT 7 (INTRODUCER) ×2 IMPLANT
KIT TURNOVER KIT B (KITS) ×4 IMPLANT
NEEDLE HYPO 25GX1X1/2 BEV (NEEDLE) IMPLANT
NEEDLE PERC 18GX7CM (NEEDLE) IMPLANT
PACK CAROTID (CUSTOM PROCEDURE TRAY) ×4 IMPLANT
PAD ARMBOARD 7.5X6 YLW CONV (MISCELLANEOUS) ×8 IMPLANT
POSITIONER HEAD DONUT 9IN (MISCELLANEOUS) ×4 IMPLANT
SET MICROPUNCTURE 5F STIFF (MISCELLANEOUS) ×4 IMPLANT
SHEATH PINNACLE 5F 10CM (SHEATH) ×4 IMPLANT
STENT TRANSCAROTID SYS 10X40 (Permanent Stent) ×4 IMPLANT
STOPCOCK 4 WAY LG BORE MALE ST (IV SETS) ×4 IMPLANT
SUT MNCRL AB 4-0 PS2 18 (SUTURE) IMPLANT
SUT PROLENE 5 0 C 1 24 (SUTURE) ×4 IMPLANT
SUT PROLENE 6 0 BV (SUTURE) ×8 IMPLANT
SUT SILK 2 0 PERMA HAND 18 BK (SUTURE) ×4 IMPLANT
SUT SILK 2 0 SH (SUTURE) ×4 IMPLANT
SUT VIC AB 3-0 SH 27 (SUTURE) ×8
SUT VIC AB 3-0 SH 27X BRD (SUTURE) ×4 IMPLANT
SYR 10ML LL (SYRINGE) ×12 IMPLANT
SYR 20ML LL LF (SYRINGE) ×4 IMPLANT
SYR CONTROL 10ML LL (SYRINGE) IMPLANT
SYSTEM TRANSCAROTID NEUROPRTCT (MISCELLANEOUS) ×2 IMPLANT
TOWEL GREEN STERILE (TOWEL DISPOSABLE) ×4 IMPLANT
TRANSCAROTID NEUROPROTECT SYS (MISCELLANEOUS) ×4
WATER STERILE IRR 1000ML POUR (IV SOLUTION) ×4 IMPLANT
WIRE BENTSON .035X145CM (WIRE) ×4 IMPLANT
WIRE SPARTACORE .014X300CM (WIRE) ×4 IMPLANT
WIRE STABILIZER XS .014X180CM (WIRE) ×4 IMPLANT
WIRE STARTER BENTSON 035X150 (WIRE) ×4 IMPLANT

## 2020-07-15 NOTE — Anesthesia Procedure Notes (Signed)
Procedure Name: Intubation Date/Time: 07/15/2020 12:42 PM Performed by: Darletta Moll, CRNA Pre-anesthesia Checklist: Patient identified, Emergency Drugs available, Suction available and Patient being monitored Patient Re-evaluated:Patient Re-evaluated prior to induction Oxygen Delivery Method: Circle system utilized Preoxygenation: Pre-oxygenation with 100% oxygen Induction Type: IV induction Ventilation: Mask ventilation without difficulty Laryngoscope Size: Mac and 4 Grade View: Grade II Tube type: Oral Tube size: 7.5 mm Number of attempts: 1 Airway Equipment and Method: Stylet Placement Confirmation: ETT inserted through vocal cords under direct vision,  positive ETCO2 and breath sounds checked- equal and bilateral Secured at: 23 cm Tube secured with: Tape Dental Injury: Teeth and Oropharynx as per pre-operative assessment

## 2020-07-15 NOTE — Progress Notes (Signed)
Pt c/o feeling like he has a full bladder.  Scanner show 600 ml of urine.  Dr. Renold Don called and ok to do In and Out cath.   Cath performed with 16 french catheter using sterile technique.  With 2 RNs at bedside.  Emptied 700 cc urine.

## 2020-07-15 NOTE — Progress Notes (Signed)
S/p left TCAR for nearly occluded symptomatic left carotid stenosis  Patient neuro intact in PACU Incisions c/d/i Stable for transfer to [facility]  Plan  :  Asa, Plavix, statin Suspect d/c in am  Wife updated on success of procedure   Wells Brbaham

## 2020-07-15 NOTE — Interval H&P Note (Signed)
History and Physical Interval Note:  07/15/2020 9:05 AM  Joe Walls  has presented today for surgery, with the diagnosis of LEFT CAROTID ARTERY STENOSIS.  The various methods of treatment have been discussed with the patient and family. After consideration of risks, benefits and other options for treatment, the patient has consented to  Procedure(s): LEFT TRANSCAROTID ARTERY REVASCULARIZATION (Left) as a surgical intervention.  The patient's history has been reviewed, patient examined, no change in status, stable for surgery.  I have reviewed the patient's chart and labs.  Questions were answered to the patient's satisfaction.     Durene Cal

## 2020-07-15 NOTE — Brief Op Note (Addendum)
07/15/2020  11:34 PM  PATIENT:  Joe Walls  69 y.o. male  PRE-OPERATIVE DIAGNOSIS:  LEFT CAROTID ARTERY STENOSIS  POST-OPERATIVE DIAGNOSIS:  LEFT CAROTID ARTERY STENOSIS  PROCEDURE: #!  Left TCAR   #2:  Flow reversal neuro protection   #3:  U/s guided access, left femoral artery   #4:  U/s access right femoral vein SURGEON:  Surgeon(s) and Role:    * Nada Libman, MD - Primary  PHYSICIAN ASSISTANT:   ASSISTANTS: Doreatha Massed   ANESTHESIA:   general  EBL:  100 mL   BLOOD ADMINISTERED:none  DRAINS: none   LOCAL MEDICATIONS USED:  NONE  SPECIMEN:  No Specimen  DISPOSITION OF SPECIMEN:  N/A  COUNTS:  YES  TOURNIQUET:  * No tourniquets in log *  DICTATION: .Dragon Dictation  PLAN OF CARE: Admit to inpatient   PATIENT DISPOSITION:  PACU - hemodynamically stable.   Delay start of Pharmacological VTE agent (>24hrs) due to surgical blood loss or risk of bleeding: no

## 2020-07-15 NOTE — Anesthesia Procedure Notes (Signed)
Arterial Line Insertion Start/End7/16/2021 11:19 AM, 07/15/2020 11:19 AM Performed by: Modena Morrow, CRNA, CRNA  Patient location: Pre-op. Preanesthetic checklist: patient identified, IV checked, site marked, risks and benefits discussed, surgical consent, monitors and equipment checked, pre-op evaluation and timeout performed Lidocaine 1% used for infiltration Right, radial was placed Catheter size: 20 Fr Hand hygiene performed  and maximum sterile barriers used   Attempts: 2 Procedure performed without using ultrasound guided technique. Following insertion, dressing applied and Biopatch. Post procedure assessment: normal and unchanged  Patient tolerated the procedure well with no immediate complications.

## 2020-07-15 NOTE — Transfer of Care (Signed)
Immediate Anesthesia Transfer of Care Note  Patient: Joe Walls  Procedure(s) Performed: LEFT TRANSCAROTID ARTERY REVASCULARIZATION (Left Neck) CAROTID ARTERY ANGIOGRAM (Left Neck) ULTRASOUND GUIDANCE FOR VASCULAR ACCESS (Left Groin)  Patient Location: PACU  Anesthesia Type:General  Level of Consciousness: drowsy and patient cooperative  Airway & Oxygen Therapy: Patient Spontanous Breathing  Post-op Assessment: Report given to RN, Post -op Vital signs reviewed and stable and Patient moving all extremities X 4  Post vital signs: Reviewed and stable  Last Vitals:  Vitals Value Taken Time  BP 101/52 07/15/20 1612  Temp    Pulse 58 07/15/20 1614  Resp 19 07/15/20 1614  SpO2 95 % 07/15/20 1614  Vitals shown include unvalidated device data.  Last Pain:  Vitals:   07/15/20 0936  TempSrc:   PainSc: 6       Patients Stated Pain Goal: 2 (07/15/20 0936)  Complications: No complications documented.

## 2020-07-16 DIAGNOSIS — I6522 Occlusion and stenosis of left carotid artery: Secondary | ICD-10-CM

## 2020-07-16 LAB — BASIC METABOLIC PANEL
Anion gap: 11 (ref 5–15)
BUN: 11 mg/dL (ref 8–23)
CO2: 21 mmol/L — ABNORMAL LOW (ref 22–32)
Calcium: 8.3 mg/dL — ABNORMAL LOW (ref 8.9–10.3)
Chloride: 107 mmol/L (ref 98–111)
Creatinine, Ser: 1.29 mg/dL — ABNORMAL HIGH (ref 0.61–1.24)
GFR calc Af Amer: >60 mL/min (ref >60)
GFR calc non Af Amer: 56 mL/min — ABNORMAL LOW (ref >60)
Glucose, Bld: 175 mg/dL — ABNORMAL HIGH (ref 70–99)
Potassium: 3.9 mmol/L (ref 3.5–5.1)
Sodium: 139 mmol/L (ref 135–145)

## 2020-07-16 LAB — CBC
HCT: 37 % — ABNORMAL LOW (ref 39.0–52.0)
Hemoglobin: 12 g/dL — ABNORMAL LOW (ref 13.0–17.0)
MCH: 31.4 pg (ref 26.0–34.0)
MCHC: 32.4 g/dL (ref 30.0–36.0)
MCV: 96.9 fL (ref 80.0–100.0)
Platelets: 158 10*3/uL (ref 150–400)
RBC: 3.82 MIL/uL — ABNORMAL LOW (ref 4.22–5.81)
RDW: 12.5 % (ref 11.5–15.5)
WBC: 7.7 10*3/uL (ref 4.0–10.5)
nRBC: 0 % (ref 0.0–0.2)

## 2020-07-16 NOTE — Progress Notes (Addendum)
  Progress Note    07/16/2020 10:08 AM 1 Day Post-Op  Subjective:  No complaints; swallowing without difficulty; voiding without difficulty.  He has walked to the bathroom.  Afebrile HR 50's-60's NSR/SB 100's-110's systolic 93% RA  Gtts:  None  Vitals:   07/16/20 0836 07/16/20 0900  BP:  (!) 100/56  Pulse:  61  Resp:  19  Temp: (!) 97.5 F (36.4 C)   SpO2:       Physical Exam: Neuro:  In tact; tongue is midline and moving all extremities equally Lungs:  Non labored Incision:  Clean and dry; both groins are soft without hematoma  CBC    Component Value Date/Time   WBC 7.7 07/16/2020 0513   RBC 3.82 (L) 07/16/2020 0513   HGB 12.0 (L) 07/16/2020 0513   HCT 37.0 (L) 07/16/2020 0513   PLT 158 07/16/2020 0513   MCV 96.9 07/16/2020 0513   MCH 31.4 07/16/2020 0513   MCHC 32.4 07/16/2020 0513   RDW 12.5 07/16/2020 0513   LYMPHSABS 1.1 05/10/2018 1443   MONOABS 0.5 05/10/2018 1443   EOSABS 0.1 05/10/2018 1443   BASOSABS 0.0 05/10/2018 1443    BMET    Component Value Date/Time   NA 139 07/16/2020 0513   K 3.9 07/16/2020 0513   CL 107 07/16/2020 0513   CO2 21 (L) 07/16/2020 0513   GLUCOSE 175 (H) 07/16/2020 0513   BUN 11 07/16/2020 0513   CREATININE 1.29 (H) 07/16/2020 0513   CALCIUM 8.3 (L) 07/16/2020 0513   GFRNONAA 56 (L) 07/16/2020 0513   GFRAA >60 07/16/2020 0513     Intake/Output Summary (Last 24 hours) at 07/16/2020 1008 Last data filed at 07/15/2020 1840 Gross per 24 hour  Intake 1100 ml  Output 100 ml  Net 1000 ml     Assessment/Plan:  This is a 69 y.o. male who is s/p left TCAR  1 Day Post-Op  -pt is doing well this am. -pt neuro exam is in tact -renal function is stable -BP soft-pt on low dose Coreg and lisinopril.  Will have him hold this for a couple of days and then restart -pt has ambulated to the bathroom but not in the hallways -pt has voided -f/u with Dr. Myra Gianotti in 4 weeks with duplex -continue plavix/asa/statin -receives  Nucynta monthly-will not give pain rx.   Doreatha Massed, PA-C [facility] [phone]   Agree with above Neuro intact Will hold some BP meds  Pt has a BP cuff at home and will continue to monitor  Fabienne Bruns, MD [facility] [place] Office: [phone]

## 2020-07-16 NOTE — Discharge Summary (Signed)
Discharge Summary     RICHEY DOOLITTLE 1951/12/21 69 y.o. male  [id number]  Admission Date: 07/15/2020  Discharge Date: 07/16/2020  Physician: Nada Libman, MD  Admission Diagnosis: Carotid artery stenosis [I65.29] Symptomatic carotid artery stenosis, left [I65.22]   HPI:   This is a 69 y.o. male who I saw on 06/25/2020 for evaluation of symptomatic carotid stenosis at the request of Dr. Allyson Sabal.  In April he had some right-sided paresthesias.  He was supposed to get a CT angiogram however this did not occur.  I ordered a CT scan for further evaluation because his carotid duplex was technically challenging.  I called him today to discuss the results.  He reports no new symptoms.   Patient has a history of tobacco abuse in the past. He has a history of premature cardiovascular disease in his father who had an MI at 25. The patient has a history of CABG in 2017. He is medically managed for hypertension with an ACE inhibitor. He takes a statin for hypercholesterolemia. He is on dual antiplatelet therapy with aspirin Plavix.  Hospital Course:  The patient was admitted to the hospital and taken to the operating room on 07/15/2020 and underwent: PROCEDURE: #!  Left TCAR                         #2:  Flow reversal neuro protection                         #3:  U/s guided access, left femoral artery                         #4:  U/s access right femoral vein  The pt tolerated the procedure well and was transported to the PACU in good condition.   By POD 1, the pt neuro status was in tact and pt doing well.  He was swallowing without difficulty, ambulating.  He is discharged home on asa/statin/plavix.  His BP is soft and we will hold his BB and ACEI for a couple of days and then restart.   The remainder of the hospital course consisted of increasing mobilization and increasing intake of solids without difficulty.   Recent Labs    07/16/20 0513  NA 139  K 3.9  CL 107  CO2  21*  GLUCOSE 175*  BUN 11  CALCIUM 8.3*   Recent Labs    07/16/20 0513  WBC 7.7  HGB 12.0*  HCT 37.0*  PLT 158   No results for input(s): INR in the last 72 hours.   Discharge Instructions    Discharge patient   Complete by: As directed    Discharge disposition: 01-Home or Self Care   Discharge patient date: 07/16/2020      Discharge Diagnosis:  Carotid artery stenosis [I65.29] Symptomatic carotid artery stenosis, left [I65.22]  Secondary Diagnosis: Patient Active Problem List   Diagnosis Date Noted  . Carotid artery stenosis 07/15/2020  . Symptomatic carotid artery stenosis, left 07/15/2020  . Carotid artery disease (HCC) 04/22/2020  . HTN (hypertension), malignant 05/21/2012  . Chest pain 05/21/2012  . PTSD (post-traumatic stress disorder) 05/21/2012  . Hyperlipidemia 05/21/2012  . Family history of coronary artery disease 05/21/2012   Past Medical History:  Diagnosis Date  . CAD (coronary artery disease)   . High cholesterol   . Hypertension   . Renal disorder     Allergies  as of 07/16/2020   No Known Allergies     Medication List    TAKE these medications   amLODipine 10 MG tablet Commonly known as: NORVASC Take 10 mg by mouth daily.   aspirin EC 81 MG tablet Take 81 mg by mouth daily.   atorvastatin 40 MG tablet Commonly known as: LIPITOR Take 40 mg by mouth daily.   carvedilol 3.125 MG tablet Commonly known as: COREG Take 3.125 mg by mouth 2 (two) times daily with a meal.   CENTRUM SILVER PO Take 1 tablet by mouth daily.   clopidogrel 75 MG tablet Commonly known as: PLAVIX Take 75 mg by mouth daily.   gabapentin 300 MG capsule Commonly known as: NEURONTIN Take 600 mg by mouth at bedtime.   levothyroxine 100 MCG tablet Commonly known as: SYNTHROID Take 100 mcg by mouth at bedtime.   lisinopril 5 MG tablet Commonly known as: ZESTRIL Take 5 mg by mouth daily.   nitroGLYCERIN 0.4 MG SL tablet Commonly known as: NITROSTAT Place  1 tablet (0.4 mg total) under the tongue every 5 (five) minutes x 3 doses as needed for chest pain.   Nucynta 100 MG Tabs Generic drug: Tapentadol HCl Take 100 mg by mouth 4 (four) times daily as needed (pain).   prazosin 5 MG capsule Commonly known as: MINIPRESS Take 5 mg by mouth at bedtime.   sulfamethoxazole-trimethoprim 400-80 MG tablet Commonly known as: BACTRIM Take 2 tablets by mouth 2 (two) times daily.        [facility] Discharge Instructions Carotid Endarterectomy (CEA)  Please refer to the following instructions for your post-procedure care. Your surgeon or physician assistant will discuss any changes with you.  Activity  You are encouraged to walk as much as you can. You can slowly return to normal activities but must avoid strenuous activity and heavy lifting until your doctor tell you it's OK. Avoid activities such as vacuuming or swinging a golf club. You can drive after one week if you are comfortable and you are no longer taking prescription pain medications. It is normal to feel tired for serval weeks after your surgery. It is also normal to have difficulty with sleep habits, eating, and bowel movements after surgery. These will go away with time.  Bathing/Showering  You may shower after you come home. Do not soak in a bathtub, hot tub, or swim until the incision heals completely.  Incision Care  Shower every day. Clean your incision with mild soap and water. Pat the area dry with a clean towel. You do not need a bandage unless otherwise instructed. Do not apply any ointments or creams to your incision. You may have skin glue on your incision. Do not peel it off. It will come off on its own in about one week. Your incision may feel thickened and raised for several weeks after your surgery. This is normal and the skin will soften over time. For Men Only: It's OK to shave around the incision but do not shave the incision itself for 2  weeks. It is common to have numbness under your chin that could last for several months.  Diet  Resume your normal diet. There are no special food restrictions following this procedure. A low fat/low cholesterol diet is recommended for all patients with vascular disease. In order to heal from your surgery, it is CRITICAL to get adequate nutrition. Your body requires vitamins, minerals, and protein. Vegetables are the best source of vitamins  and minerals. Vegetables also provide the perfect balance of protein. Processed food has little nutritional value, so try to avoid this.  Medications  Resume taking all of your medications unless your doctor or physician assistant tells you not to.  If your incision is causing pain, you may take over-the- counter pain relievers such as acetaminophen (Tylenol). If you were prescribed a stronger pain medication, please be aware these medications can cause nausea and constipation.  Prevent nausea by taking the medication with a snack or meal. Avoid constipation by drinking plenty of fluids and eating foods with a high amount of fiber, such as fruits, vegetables, and grains.  Do not take Tylenol if you are taking prescription pain medications.  Hold on taking your coreg and lisinopril x 48 hours and then start taking again due to soft blood pressure after surgery.  Follow Up  Our office will schedule a follow up appointment 2-3 weeks following discharge.  Please call us immediately for any of the following conditions  . Increased pain, redness, drainage (pus) from your incision site. . Fever of 101 degrees or higher. . If you should develop stroke (slurred speech, difficulty swallowing, weakness on one side of your body, loss of vision) you should call 911 and go to the nearest emergency room. .  Reduce your risk of vascular disease:  . Stop smoking. If you would like help call [facility] at 1-800-QUIT-NOW (901-109-8054) or [place] at  (847) 075-2493. . Manage your cholesterol . Maintain a desired weight . Control your diabetes . Keep your blood pressure down .  If you have any questions, please call the office at [phone].  Prescriptions given: 1. No pain medicine rx given  Disposition: home  Patient's condition: is Good  Follow up: 1. Dr. Myra Gianotti in 4 weeks with duplex   Doreatha Massed, PA-C [facility] [phone]   --- For [facility] Registry use ---   Modified Rankin score at D/C (0-6): 0  IV medication needed for:  1. Hypertension: No 2. Hypotension: No  Post-op Complications: No  1. Post-op CVA or TIA: No  If yes: Event classification (right eye, left eye, right cortical, left cortical, verterobasilar, other): n/a  If yes: Timing of event (intra-op, <6 hrs post-op, >=6 hrs post-op, unknown): n/a  2. CN injury: No  If yes: CN n/a injuried   3. Myocardial infarction: No  If yes: Dx by (EKG or clinical, Troponin): n/a  4.  CHF: No  5.  Dysrhythmia (new): No  6. Wound infection: No  7. Reperfusion symptoms: No  8. Return to OR: No  If yes: return to OR for (bleeding, neurologic, other CEA incision, other): n/a  Discharge medications: Statin use:  Yes ASA use:  Yes   Beta blocker use:  Yes ACE-Inhibitor use:  Yes  ARB use:  No CCB use: No P2Y12 Antagonist use: Yes, [ x] Plavix, [ ]  Plasugrel, [ ]  Ticlopinine, [ ]  Ticagrelor, [ ]  Other, [ ]  No for medical reason, [ ]  Non-compliant, [ ]  Not-indicated Anti-coagulant use:  No, [ ]  Warfarin, [ ]  Rivaroxaban, [ ]  Dabigatran,

## 2020-07-16 NOTE — Anesthesia Postprocedure Evaluation (Signed)
Anesthesia Post Note  Patient: Joe Walls  Procedure(s) Performed: LEFT TRANSCAROTID ARTERY REVASCULARIZATION (Left Neck) CAROTID ARTERY ANGIOGRAM (Left Neck) ULTRASOUND GUIDANCE FOR VASCULAR ACCESS (Left Groin)     Patient location during evaluation: PACU Anesthesia Type: General Level of consciousness: sedated and patient cooperative Pain management: pain level controlled Vital Signs Assessment: post-procedure vital signs reviewed and stable Respiratory status: spontaneous breathing Cardiovascular status: stable Anesthetic complications: no   No complications documented.  Last Vitals:  Vitals:   07/16/20 0900 07/16/20 1247  BP: (!) 100/56 (!) 108/53  Pulse: 61 64  Resp: 19   Temp:  36.4 C  SpO2:      Last Pain:  Vitals:   07/16/20 1247  TempSrc: Oral  PainSc:                  Lewie Loron

## 2020-07-16 NOTE — Discharge Instructions (Signed)
[facility]  Discharge Instructions   Carotid Surgery  Please refer to the following instructions for your post-procedure care. Your surgeon or physician assistant will discuss any changes with you.  Activity  You are encouraged to walk as much as you can. You can slowly return to normal activities but must avoid strenuous activity and heavy lifting until your doctor tell you it's okay. Avoid activities such as vacuuming or swinging a golf club. You can drive after one week if you are comfortable and you are no longer taking prescription pain medications. It is normal to feel tired for serval weeks after your surgery. It is also normal to have difficulty with sleep habits, eating, and bowel movements after surgery. These will go away with time.  Bathing/Showering  Shower daily after you go home. Do not soak in a bathtub, hot tub, or swim until the incision heals completely.  Incision Care  Shower every day. Clean your incision with mild soap and water. Pat the area dry with a clean towel. You do not need a bandage unless otherwise instructed. Do not apply any ointments or creams to your incision. You may have skin glue on your incision. Do not peel it off. It will come off on its own in about one week. Your incision may feel thickened and raised for several weeks after your surgery. This is normal and the skin will soften over time.   For Men Only: It's okay to shave around the incision but do not shave the incision itself for 2 weeks. It is common to have numbness under your chin that could last for several months.  Diet  Resume your normal diet. There are no special food restrictions following this procedure. A low fat/low cholesterol diet is recommended for all patients with vascular disease. In order to heal from your surgery, it is CRITICAL to get adequate nutrition. Your body requires vitamins, minerals, and protein. Vegetables are the best source of  vitamins and minerals. Vegetables also provide the perfect balance of protein. Processed food has little nutritional value, so try to avoid this.  Medications  Resume taking all of your medications unless your doctor or physician assistant tells you not to. If your incision is causing pain, you may take over-the- counter pain relievers such as acetaminophen (Tylenol). If you were prescribed a stronger pain medication, please be aware these medications can cause nausea and constipation. Prevent nausea by taking the medication with a snack or meal. Avoid constipation by drinking plenty of fluids and eating foods with a high amount of fiber, such as fruits, vegetables, and grains.  Do not take Tylenol if you are taking prescription pain medications.  Hold on taking your coreg and lisinopril x 48 hours and then start taking again due to soft blood pressure after surgery.  Follow Up  Our office will schedule a follow up appointment 2-3 weeks following discharge.  Please call us immediately for any of the following conditions   Increased pain, redness, drainage (pus) from your incision site.  Fever of 101 degrees or higher.  If you should develop stroke (slurred speech, difficulty swallowing, weakness on one side of your body, loss of vision) you should call 911 and go to the nearest emergency room.   Reduce your risk of vascular disease:   Stop smoking. If you would like help call [facility] at 1-800-QUIT-NOW ((780)783-5804) or [place] at (914)564-1514.  Manage your cholesterol  Maintain a desired weight  Control your  diabetes  Keep your blood pressure down   If you have any questions, please call the office at [phone].

## 2020-07-16 NOTE — Op Note (Signed)
Patient name: Joe Walls MRN: [id number] DOB: June 24, 1951 Sex: male  07/15/2020 Pre-operative Diagnosis: Symptomatic left carotid stenosis Post-operative diagnosis:  Same Surgeon:  Durene Cal Assistants: Lelon Mast Ryne Procedure:   #1: Left transcarotid artery revascularization (stenting)   #2: Flow reversal neuro protection   #3: Ultrasound-guided access, left femoral artery   #4: Ultrasound-guided access, right femoral vein   #5: First order catheterization of the left carotid artery from the groin with left carotid angiogram   #6: Closure device of left common femoral artery with Mynx Anesthesia: General Blood Loss: Minimal Specimens: None  Findings: Subtotal occlusion in the left carotid artery successfully crossed and stented Predilatation balloon: 6 x 30 Stent: 10 x 40 ENROUTE Contrast: 75 cc Dose area: 8441: 6 Fluoroscopy time: 19 minutes Procedure time: 100 minutes Embolic protection time: 50 minutes  Indications: This is a 69 year old gentleman with prior right carotid stenting performed in [place] who I saw in the office for the first time approximately a week ago.  He had had several episodes of amaurosis.  An outside carotid ultrasound was nondiagnostic.  I ordered a CT scan which reported a left carotid occlusion.  This was a short occlusion and I felt that there was a reasonable chance that this was just a very tight string sign therefore I told the patient that we would come in for diagnostic arteriogram and if stenting were possible, I would proceed with left TCAR  Procedure:  The patient was identified in the holding area and taken to [facility] OR ROOM 16  The patient was then placed supine on the table. general anesthesia was administered.  The patient was prepped and draped in the usual sterile fashion.  A time out was called and antibiotics were administered.  PA assistance was needed for the TCAR portion of the procedure including assistance with suction  retraction and wire exchanges.  Ultrasound was used to evaluate the left common femoral artery which was widely patent.  It was cannulated under ultrasound guidance with a micropuncture needle.  A 018 wire was inserted and a micropuncture sheath was placed.  A Bentson wire was then inserted followed by 5 French sheath.  I then used a Berenstein 2 catheter to select the left common carotid artery and left carotid angiogram was performed.  This was somewhat difficult to interpret as there was an ulcerated area at the internal carotid origin that was clearly occluded however there did appear to be a trace of contrast going into the internal carotid artery.  With these images I elected to proceed with TCAR.  Ultrasound was used to evaluate the right common femoral vein which was widely patent and easily compressible.  It was cannulated under ultrasound guidance with an 18-gauge needle.  A Bentson wire was then inserted without difficulty and the TCAR sheath was placed.  This flushed and aspirated without difficulty.  Ultrasound was used to identify the location of the left common carotid artery.  A transverse incision was made in the supraclavicular space.  Cautery was used about subcutaneous tissue and platysma muscle.  Identified the 2 heads of the sternocleidomastoid and bluntly separated them.  I then sharply dissected out the common carotid artery.  There was calcification in certain areas however there was a soft spot which I could cannulate.  The vagus nerve was protected.  A Potts vessel loop and an umbilical tape were used to encircle the base of the common carotid artery.  The patient was fully heparinized.  A 50U stitch was placed in the common carotid artery and it was then cannulated with a micropuncture needle.  A 018 wire was then inserted up to the black line and a micropuncture sheath was placed.  A contrast injection was then performed which located the carotid bifurcation which was marked on the  screen.  A carotid Amplatz wire was then inserted up to the black line and the TCAR sheath was inserted without difficulty.  It was sutured into position with two 2-0 silk sutures.  Next the flow reversal tubing was connected.  Passive flow reversal was confirmed with saline washout.  A TCAR timeout was then performed.  ACT was greater than 300 systolic blood pressure was over 160 and heart rate was over 60.  A Berenstein 2 catheter and 014 wire were then inserted.  Active flow reversal was begun.  This was confirmed with saline washout.  I then tried to cannulate the internal carotid artery.  Initially this was unsuccessful however with a V 14 wire I was able to get into the internal carotid artery.  Unfortunately we lost access.  I was unable to recannulate the internal carotid artery with a stabilizer wire and the Berenstein catheter.  I then performed predilatation using a 6 x 30 balloon.  I performed arteriography that showed a patent internal carotid artery.  I then elected to stent.  I upsized the stent so as to limit the cell diameter to potentially avoid embolic issues.  A 10 x 40 ENROUTE stent was then inserted and successfully deployed.  A completion angiogram was performed which showed a widely patent internal carotid artery.  I also performed intracranial imaging.  I was satisfied with these results.  The wire was removed.  Active flow reversal was discontinued and the blood was returned to the patient through the femoral sheath.  The TCAR sheath was removed and the arteriotomy closed by securing the 5-0 Prolene.  The sheath in the artery on the left side was closed with a minx.  The heparin was reversed with 50 mg of protamine.  The femoral sheath in the vein was then removed and manual pressure was held.  The carotid incision was irrigated.  It was hemostatic.  The platysma muscle was closed with 3-0 Vicryl and skin was closed with 3-0 Vicryl followed by Dermabond.  Patient tolerated the procedure  well.  He was successfully awakened from anesthesia.  He was neurologically intact.   Disposition: To PACU stable.   Juleen China, M.D., [facility] [place] Office: [phone] Pager:  [phone]

## 2020-07-18 ENCOUNTER — Encounter (HOSPITAL_COMMUNITY): Payer: Self-pay | Admitting: Surgery

## 2020-08-01 DIAGNOSIS — M542 Cervicalgia: Secondary | ICD-10-CM | POA: Diagnosis not present

## 2020-08-01 DIAGNOSIS — Z79899 Other long term (current) drug therapy: Secondary | ICD-10-CM | POA: Diagnosis not present

## 2020-08-01 DIAGNOSIS — R768 Other specified abnormal immunological findings in serum: Secondary | ICD-10-CM | POA: Diagnosis not present

## 2020-08-01 DIAGNOSIS — G8929 Other chronic pain: Secondary | ICD-10-CM | POA: Diagnosis not present

## 2020-08-02 ENCOUNTER — Other Ambulatory Visit: Payer: Self-pay

## 2020-08-02 DIAGNOSIS — I6523 Occlusion and stenosis of bilateral carotid arteries: Secondary | ICD-10-CM

## 2020-08-15 ENCOUNTER — Ambulatory Visit (HOSPITAL_COMMUNITY)
Admission: RE | Admit: 2020-08-15 | Discharge: 2020-08-15 | Disposition: A | Discharge status: Home / Self Care | Payer: HMO | Source: Ambulatory Visit | Attending: Surgery | Admitting: Surgery

## 2020-08-15 ENCOUNTER — Ambulatory Visit (INDEPENDENT_AMBULATORY_CARE_PROVIDER_SITE_OTHER): Payer: HMO | Admitting: Surgery

## 2020-08-15 ENCOUNTER — Other Ambulatory Visit: Payer: Self-pay

## 2020-08-15 ENCOUNTER — Encounter: Payer: Self-pay | Admitting: Surgery

## 2020-08-15 VITALS — BP 109/71 | HR 54 | Temp 98.2°F | Resp 20 | Ht 70.0 in | Wt 182.2 lb

## 2020-08-15 DIAGNOSIS — I6523 Occlusion and stenosis of bilateral carotid arteries: Secondary | ICD-10-CM

## 2020-08-15 NOTE — Progress Notes (Signed)
Patient name: Joe Walls MRN: [id number] DOB: 05-09-1951 Sex: male  REASON FOR VISIT:     post op  HISTORY OF PRESENT ILLNESS:   Joe Walls is a 69 y.o. male who is status post left TCAR on 7-16-201 for symptomatic stenosis.  He was found to have a sub-total occlusion which was stented.  His pre-operative symptoms were amaurosis.  He has not had any new symptoms since his surgery.  He continues to take his aspirin, Plavix, and statin.    Patient has a history of tobacco abuse in the past. He has a history of premature cardiovascular disease in his father who had an MI at 25. The patient has a history of CABG in 2017. He is medically managed for hypertension with an ACE inhibitor. He takes a statin for hypercholesterolemia. He is on dual antiplatelet therapy with aspirin Plavix.   CURRENT MEDICATIONS:    Current Outpatient Medications  Medication Sig Dispense Refill  . amLODipine (NORVASC) 10 MG tablet Take 10 mg by mouth daily.    Marland Kitchen aspirin EC 81 MG tablet Take 81 mg by mouth daily.    Marland Kitchen atorvastatin (LIPITOR) 40 MG tablet Take 40 mg by mouth daily.    . carvedilol (COREG) 3.125 MG tablet Take 3.125 mg by mouth 2 (two) times daily with a meal.    . clopidogrel (PLAVIX) 75 MG tablet Take 75 mg by mouth daily.     Marland Kitchen gabapentin (NEURONTIN) 300 MG capsule Take 600 mg by mouth at bedtime.     Marland Kitchen levothyroxine (SYNTHROID) 100 MCG tablet Take 100 mcg by mouth at bedtime.     Marland Kitchen lisinopril (ZESTRIL) 5 MG tablet Take 5 mg by mouth daily.    . Multiple Vitamins-Minerals (CENTRUM SILVER PO) Take 1 tablet by mouth daily.    . NUCYNTA 100 MG TABS Take 100 mg by mouth 4 (four) times daily as needed (pain).     . prazosin (MINIPRESS) 5 MG capsule Take 5 mg by mouth at bedtime.    . sulfamethoxazole-trimethoprim (BACTRIM) 400-80 MG tablet Take 2 tablets by mouth 2 (two) times daily. 12 tablet 0  . nitroGLYCERIN (NITROSTAT) 0.4 MG SL tablet Place 1  tablet (0.4 mg total) under the tongue every 5 (five) minutes x 3 doses as needed for chest pain. 60 tablet 3   No current facility-administered medications for this visit.    REVIEW OF SYSTEMS:   [X]  denotes positive finding, [ ]  denotes negative finding Cardiac  Comments:  Chest pain or chest pressure:    Shortness of breath upon exertion:    Short of breath when lying flat:    Irregular heart rhythm:    Constitutional    Fever or chills:      PHYSICAL EXAM:   Vitals:   08/15/20 0840 08/15/20 0842  BP: 119/74 109/71  Pulse: (!) 54   Resp: 20   Temp: 98.2 F (36.8 C)   SpO2: 98%   Weight: 182 lb 3.2 oz (82.6 kg)   Height: 5\' 10"  (1.778 m)     GENERAL: The patient is a well-nourished male, in no acute distress. The vital signs are documented above. CARDIOVASCULAR: There is a regular rate and rhythm. PULMONARY: Non-labored respirations Incision is healing nicely  STUDIES:   Carotid duplex shows that his stent is widely patent.   MEDICAL ISSUES:   The patient will return in 6 months for repeat duplex.  He has approximately 50% stenosis on the right side stent.  He will continue his aspirin statin and Plavix.  Charlena Cross, MD, FACS [facility] [phone] Pager [phone]

## 2020-08-17 ENCOUNTER — Other Ambulatory Visit: Payer: Self-pay | Admitting: *Deleted

## 2020-08-17 DIAGNOSIS — I6523 Occlusion and stenosis of bilateral carotid arteries: Secondary | ICD-10-CM

## 2020-08-24 ENCOUNTER — Encounter (HOSPITAL_COMMUNITY): Payer: Self-pay | Admitting: Emergency Medicine

## 2020-08-24 ENCOUNTER — Other Ambulatory Visit: Payer: Self-pay

## 2020-08-24 ENCOUNTER — Emergency Department (HOSPITAL_COMMUNITY): Payer: HMO

## 2020-08-24 ENCOUNTER — Emergency Department (HOSPITAL_COMMUNITY)
Admission: EM | Admit: 2020-08-24 | Discharge: 2020-08-24 | Disposition: A | Discharge status: Home / Self Care | Payer: HMO | Attending: Emergency Medicine | Admitting: Emergency Medicine

## 2020-08-24 DIAGNOSIS — I10 Essential (primary) hypertension: Secondary | ICD-10-CM | POA: Diagnosis not present

## 2020-08-24 DIAGNOSIS — I2510 Atherosclerotic heart disease of native coronary artery without angina pectoris: Secondary | ICD-10-CM | POA: Insufficient documentation

## 2020-08-24 DIAGNOSIS — Z87891 Personal history of nicotine dependence: Secondary | ICD-10-CM | POA: Diagnosis not present

## 2020-08-24 DIAGNOSIS — Z79899 Other long term (current) drug therapy: Secondary | ICD-10-CM | POA: Diagnosis not present

## 2020-08-24 DIAGNOSIS — R21 Rash and other nonspecific skin eruption: Secondary | ICD-10-CM | POA: Diagnosis not present

## 2020-08-24 DIAGNOSIS — R079 Chest pain, unspecified: Secondary | ICD-10-CM | POA: Diagnosis not present

## 2020-08-24 DIAGNOSIS — R002 Palpitations: Secondary | ICD-10-CM

## 2020-08-24 DIAGNOSIS — Z7982 Long term (current) use of aspirin: Secondary | ICD-10-CM | POA: Insufficient documentation

## 2020-08-24 DIAGNOSIS — I517 Cardiomegaly: Secondary | ICD-10-CM | POA: Diagnosis not present

## 2020-08-24 LAB — CBC
HCT: 39.7 % (ref 39.0–52.0)
Hemoglobin: 13.1 g/dL (ref 13.0–17.0)
MCH: 32.1 pg (ref 26.0–34.0)
MCHC: 33 g/dL (ref 30.0–36.0)
MCV: 97.3 fL (ref 80.0–100.0)
Platelets: 172 10*3/uL (ref 150–400)
RBC: 4.08 MIL/uL — ABNORMAL LOW (ref 4.22–5.81)
RDW: 12.5 % (ref 11.5–15.5)
WBC: 5.8 10*3/uL (ref 4.0–10.5)
nRBC: 0.3 % — ABNORMAL HIGH (ref 0.0–0.2)

## 2020-08-24 LAB — BASIC METABOLIC PANEL
Anion gap: 8 (ref 5–15)
BUN: 13 mg/dL (ref 8–23)
CO2: 27 mmol/L (ref 22–32)
Calcium: 9.1 mg/dL (ref 8.9–10.3)
Chloride: 105 mmol/L (ref 98–111)
Creatinine, Ser: 1.09 mg/dL (ref 0.61–1.24)
GFR calc Af Amer: >60 mL/min (ref >60)
GFR calc non Af Amer: >60 mL/min (ref >60)
Glucose, Bld: 93 mg/dL (ref 70–99)
Potassium: 3.9 mmol/L (ref 3.5–5.1)
Sodium: 140 mmol/L (ref 135–145)

## 2020-08-24 LAB — TROPONIN I (HIGH SENSITIVITY)
Troponin I (High Sensitivity): 9 ng/L (ref <18)
Troponin I (High Sensitivity): 11 ng/L (ref <18)

## 2020-08-24 NOTE — ED Triage Notes (Signed)
Pt BIB [facility] with c/o chest pain.  St's he was out playing pool when he developed left chest pain.  Pt st's he took NTG 1 SL with some relief.  EMS gave pt ASA 324mg 

## 2020-08-24 NOTE — ED Provider Notes (Signed)
[facility] EMERGENCY DEPARTMENT Provider Note   CSN: [id number] Arrival date & time: 08/24/20  0104     History Chief Complaint  Patient presents with  . Chest Pain    Joe Walls is a 70 y.o. male.  The history is provided by the patient and medical records. No language interpreter was used.  Chest Pain  Joe Walls is a 69 y.o. male who presents to the Emergency Department complaining of palpitations. He presents the emergency department for evaluation of episode of palpitations that began last night while playing pool. He states that he suddenly felt like his heart was beating quickly. The episode lasted about five minutes and he had to go outside. He took nitroglycerin and the episode resolved. He did have transient diaphoresis on ED arrival that is now resolved. He denies any chest pain, difficulty breathing, nausea, vomiting, abdominal pain, diarrhea. He does have occasional lower extremity edema, no significant current edema. One month ago he was started on Plavix for endarterectomy. He also takes aspirin daily. He does have a history of coronary artery disease. His cardiologist is at [facility]. He does not smoke tobacco. He drinks occasional alcohol. No street drug use. Symptoms are transient and resolved. He did have an episode of palpitations about a week ago as well.    Past Medical History:  Diagnosis Date  . CAD (coronary artery disease)   . High cholesterol   . Hypertension   . Renal disorder     Patient Active Problem List   Diagnosis Date Noted  . Carotid artery stenosis 07/15/2020  . Symptomatic carotid artery stenosis, left 07/15/2020  . Carotid artery disease (HCC) 04/22/2020  . HTN (hypertension), malignant 05/21/2012  . Chest pain 05/21/2012  . PTSD (post-traumatic stress disorder) 05/21/2012  . Hyperlipidemia 05/21/2012  . Family history of coronary artery disease 05/21/2012    Past Surgical History:  Procedure  Laterality Date  . CORONARY ARTERY BYPASS GRAFT    . KNEE SURGERY    . TRANSCAROTID ARTERY REVASCULARIZATION Left 07/15/2020   Procedure: LEFT TRANSCAROTID ARTERY REVASCULARIZATION;  Surgeon: Nada Libman, MD;  Location: [facility] OR;  Service: Vascular;  Laterality: Left;  . ULTRASOUND GUIDANCE FOR VASCULAR ACCESS Left 07/15/2020   Procedure: ULTRASOUND GUIDANCE FOR VASCULAR ACCESS;  Surgeon: Nada Libman, MD;  Location: [facility] OR;  Service: Vascular;  Laterality: Left;  . UPPER EXTREMITY ANGIOGRAM Left 07/15/2020   Procedure: CAROTID ARTERY ANGIOGRAM;  Surgeon: Nada Libman, MD;  Location: [facility] OR;  Service: Vascular;  Laterality: Left;       Family History  Problem Relation Age of Onset  . Coronary artery disease Father 55       died of an MI    Social History   Tobacco Use  . Smoking status: Former Smoker    Quit date: 05/21/1992    Years since quitting: 28.2  . Smokeless tobacco: Never Used  Vaping Use  . Vaping Use: Never used  Substance Use Topics  . Alcohol use: Yes    Comment: beer every other day  . Drug use: No    Home Medications Prior to Admission medications   Medication Sig Start Date End Date Taking? Authorizing Provider  amLODipine (NORVASC) 10 MG tablet Take 10 mg by mouth daily.   Yes [provider]  aspirin EC 81 MG tablet Take 81 mg by mouth daily.   Yes [provider]  atorvastatin (LIPITOR) 40 MG tablet Take 40 mg  by mouth at bedtime.    Yes [provider]  carvedilol (COREG) 3.125 MG tablet Take 3.125 mg by mouth 2 (two) times daily with a meal.   Yes [provider]  clopidogrel (PLAVIX) 75 MG tablet Take 75 mg by mouth daily.    Yes [provider]  gabapentin (NEURONTIN) 300 MG capsule Take 600 mg by mouth at bedtime.    Yes [provider]  levothyroxine (SYNTHROID) 100 MCG tablet Take 100 mcg by mouth at bedtime.    Yes [provider]  lisinopril (ZESTRIL) 5 MG tablet Take 5 mg by  mouth daily.   Yes [provider]  Multiple Vitamin (MULTIVITAMIN WITH MINERALS) TABS tablet Take 1 tablet by mouth daily.   Yes [provider]  Multiple Vitamins-Minerals (CENTRUM SILVER PO) Take 1 tablet by mouth daily.   Yes [provider]  nitroGLYCERIN (NITROSTAT) 0.4 MG SL tablet Place 1 tablet (0.4 mg total) under the tongue every 5 (five) minutes x 3 doses as needed for chest pain. 05/21/12 08/24/20 Yes Rai, Ripudeep K, MD  NUCYNTA 100 MG TABS Take 100 mg by mouth 4 (four) times daily as needed (pain).  05/02/20  Yes [provider]  prazosin (MINIPRESS) 5 MG capsule Take 5 mg by mouth at bedtime.   Yes [provider]  sulfamethoxazole-trimethoprim (BACTRIM) 400-80 MG tablet Take 2 tablets by mouth 2 (two) times daily. Patient not taking: Reported on 08/24/2020 07/13/20   Nada Libman, MD    Allergies    Patient has no known allergies.  Review of Systems   Review of Systems  Cardiovascular: Positive for chest pain.  All other systems reviewed and are negative.   Physical Exam Updated Vital Signs BP (!) 151/82 (BP Location: Right Arm)   Pulse 67   Temp 97.7 F (36.5 C) (Oral)   Resp 18   Ht 5\' 10"  (1.778 m)   Wt 84.4 kg   SpO2 99%   BMI 26.69 kg/m   Physical Exam Vitals and nursing note reviewed.  Constitutional:      Appearance: He is well-developed.  HENT:     Head: Normocephalic and atraumatic.  Cardiovascular:     Rate and Rhythm: Normal rate and regular rhythm.     Heart sounds: No murmur heard.   Pulmonary:     Effort: Pulmonary effort is normal. No respiratory distress.     Breath sounds: Normal breath sounds.  Abdominal:     Palpations: Abdomen is soft.     Tenderness: There is no abdominal tenderness. There is no guarding or rebound.  Musculoskeletal:        General: No swelling or tenderness.  Skin:    General: Skin is warm and dry.  Neurological:     Mental Status: He is alert and oriented to person,  place, and time.  Psychiatric:        Behavior: Behavior normal.     ED Results / Procedures / Treatments   Labs (all labs ordered are listed, but only abnormal results are displayed) Labs Reviewed  CBC - Abnormal; Notable for the following components:      Result Value   RBC 4.08 (*)    nRBC 0.3 (*)    All other components within normal limits  BASIC METABOLIC PANEL  TROPONIN I (HIGH SENSITIVITY)  TROPONIN I (HIGH SENSITIVITY)    EKG EKG Interpretation  Date/Time:  Wednesday August 24 2020 01:11:15 EDT Ventricular Rate:  59 PR Interval:  182 QRS Duration: 116 QT Interval:  412 QTC Calculation: 407 R Axis:   64 Text Interpretation: Sinus bradycardia Anterior infarct , age undetermined Abnormal ECG When compared with ECG of 04/05/2018, No significant change was found Confirmed by Dione Booze ([id number]) on 08/24/2020 1:25:14 AM   Radiology DG Chest 2 View  Result Date: 08/24/2020 CLINICAL DATA:  69 year old male with chest pain. EXAM: CHEST - 2 VIEW COMPARISON:  Chest radiograph dated 05/25/2020 FINDINGS: No focal consolidation, pleural effusion, pneumothorax. Borderline cardiomegaly. Median sternotomy wires and CABG vascular clips. No acute osseous pathology. Degenerative changes of the spine. IMPRESSION: No active cardiopulmonary disease. Electronically Signed   By: Elgie Collard M.D.   On: 08/24/2020 01:51    Procedures Procedures (including critical care time)  Medications Ordered in ED Medications - No data to display  ED Course  I have reviewed the triage vital signs and the nursing notes.  Pertinent labs & imaging results that were available during my care of the patient were reviewed by me and considered in my medical decision making (see chart for details).    MDM Rules/Calculators/A&P                         Patient with history of coronary artery disease here for evaluation following episode of palpitations, resolved on ED arrival. EKG is unchanged from  baseline in troponin's are negative times two. Presentation is not consistent with ACS, life-threatening arrhythmia, PE, acute CHF, pneumonia. Discussed with patient home care for palpitations. Discussed importance of close cardiology follow-up as well as return precautions. Final Clinical Impression(s) / ED Diagnoses Final diagnoses:  Palpitations    Rx / DC Orders ED Discharge Orders    None       Tilden Fossa, MD 08/24/20 [phone]

## 2020-08-25 DIAGNOSIS — Z09 Encounter for follow-up examination after completed treatment for conditions other than malignant neoplasm: Secondary | ICD-10-CM | POA: Diagnosis not present

## 2020-08-29 DIAGNOSIS — R000 Tachycardia, unspecified: Secondary | ICD-10-CM | POA: Diagnosis not present

## 2020-09-01 DIAGNOSIS — R768 Other specified abnormal immunological findings in serum: Secondary | ICD-10-CM | POA: Diagnosis not present

## 2020-09-01 DIAGNOSIS — I10 Essential (primary) hypertension: Secondary | ICD-10-CM | POA: Diagnosis not present

## 2020-09-01 DIAGNOSIS — Z79899 Other long term (current) drug therapy: Secondary | ICD-10-CM | POA: Diagnosis not present

## 2020-09-01 DIAGNOSIS — G8929 Other chronic pain: Secondary | ICD-10-CM | POA: Diagnosis not present

## 2020-09-01 DIAGNOSIS — M542 Cervicalgia: Secondary | ICD-10-CM | POA: Diagnosis not present

## 2020-09-08 DIAGNOSIS — Z951 Presence of aortocoronary bypass graft: Secondary | ICD-10-CM | POA: Diagnosis not present

## 2020-09-08 DIAGNOSIS — I10 Essential (primary) hypertension: Secondary | ICD-10-CM | POA: Diagnosis not present

## 2020-09-08 DIAGNOSIS — I517 Cardiomegaly: Secondary | ICD-10-CM | POA: Diagnosis not present

## 2020-09-08 DIAGNOSIS — I2510 Atherosclerotic heart disease of native coronary artery without angina pectoris: Secondary | ICD-10-CM | POA: Diagnosis not present

## 2020-09-19 DIAGNOSIS — I2510 Atherosclerotic heart disease of native coronary artery without angina pectoris: Secondary | ICD-10-CM | POA: Diagnosis not present

## 2020-09-19 DIAGNOSIS — I517 Cardiomegaly: Secondary | ICD-10-CM | POA: Diagnosis not present

## 2020-09-30 DIAGNOSIS — I10 Essential (primary) hypertension: Secondary | ICD-10-CM | POA: Diagnosis not present

## 2020-09-30 DIAGNOSIS — Z23 Encounter for immunization: Secondary | ICD-10-CM | POA: Diagnosis not present

## 2020-09-30 DIAGNOSIS — R7303 Prediabetes: Secondary | ICD-10-CM | POA: Diagnosis not present

## 2020-09-30 DIAGNOSIS — Z79899 Other long term (current) drug therapy: Secondary | ICD-10-CM | POA: Diagnosis not present

## 2020-09-30 DIAGNOSIS — G8929 Other chronic pain: Secondary | ICD-10-CM | POA: Diagnosis not present

## 2020-09-30 DIAGNOSIS — E782 Mixed hyperlipidemia: Secondary | ICD-10-CM | POA: Diagnosis not present

## 2020-09-30 DIAGNOSIS — R768 Other specified abnormal immunological findings in serum: Secondary | ICD-10-CM | POA: Diagnosis not present

## 2020-09-30 DIAGNOSIS — M542 Cervicalgia: Secondary | ICD-10-CM | POA: Diagnosis not present

## 2020-10-03 DIAGNOSIS — I472 Ventricular tachycardia: Secondary | ICD-10-CM | POA: Diagnosis not present

## 2020-10-03 DIAGNOSIS — I517 Cardiomegaly: Secondary | ICD-10-CM | POA: Diagnosis not present

## 2020-10-03 DIAGNOSIS — I493 Ventricular premature depolarization: Secondary | ICD-10-CM | POA: Diagnosis not present

## 2020-10-20 DIAGNOSIS — R768 Other specified abnormal immunological findings in serum: Secondary | ICD-10-CM | POA: Diagnosis not present

## 2020-10-20 DIAGNOSIS — Z7901 Long term (current) use of anticoagulants: Secondary | ICD-10-CM | POA: Diagnosis not present

## 2020-10-20 DIAGNOSIS — R1033 Periumbilical pain: Secondary | ICD-10-CM | POA: Diagnosis not present

## 2020-10-24 DIAGNOSIS — M79604 Pain in right leg: Secondary | ICD-10-CM | POA: Diagnosis not present

## 2020-10-24 DIAGNOSIS — I6523 Occlusion and stenosis of bilateral carotid arteries: Secondary | ICD-10-CM | POA: Diagnosis not present

## 2020-10-24 DIAGNOSIS — M79605 Pain in left leg: Secondary | ICD-10-CM | POA: Diagnosis not present

## 2020-10-31 DIAGNOSIS — M542 Cervicalgia: Secondary | ICD-10-CM | POA: Diagnosis not present

## 2020-10-31 DIAGNOSIS — Z79899 Other long term (current) drug therapy: Secondary | ICD-10-CM | POA: Diagnosis not present

## 2020-10-31 DIAGNOSIS — G8929 Other chronic pain: Secondary | ICD-10-CM | POA: Diagnosis not present

## 2020-11-03 ENCOUNTER — Other Ambulatory Visit: Payer: Self-pay

## 2020-11-03 NOTE — Patient Outreach (Addendum)
  [facility]) Care Management Chronic Special Needs Program    11/03/2020  Name: Joe Walls, DOB: Feb 23, 1951  MRN: [id number]   [facility] Care Management will continue to provide services for this member through 12/30/2020. The [facility] Care Management Team will assume care 12/31/2020.   Kathyrn Sheriff, RN, MSN, [facility] Chronic Care Management Coordinator [facility] [phone]

## 2020-11-04 DIAGNOSIS — I10 Essential (primary) hypertension: Secondary | ICD-10-CM | POA: Diagnosis not present

## 2020-11-04 DIAGNOSIS — I472 Ventricular tachycardia: Secondary | ICD-10-CM | POA: Diagnosis not present

## 2020-11-04 DIAGNOSIS — I447 Left bundle-branch block, unspecified: Secondary | ICD-10-CM | POA: Diagnosis not present

## 2020-11-04 DIAGNOSIS — I25718 Atherosclerosis of autologous vein coronary artery bypass graft(s) with other forms of angina pectoris: Secondary | ICD-10-CM | POA: Diagnosis not present

## 2020-11-04 DIAGNOSIS — E039 Hypothyroidism, unspecified: Secondary | ICD-10-CM | POA: Diagnosis not present

## 2020-11-18 ENCOUNTER — Other Ambulatory Visit: Payer: Self-pay

## 2020-11-18 NOTE — Patient Outreach (Signed)
  [facility]) Care Management Chronic Special Needs Program    11/18/2020  Name: Joe Walls, DOB: 06/12/1951  MRN: [id number]   Joe Walls is enrolled in a chronic special needs plan for Heart Failure. RNCM received notification from Care Management Assistant: Client "request some help with Nucynta and referral placed to [facility] Pharmacist for medication assistance. Per follow up, [facility] pharmacist is in process of outreach attempts-has made one unsuccessful outreach at this time and additional outreaches planned.   [facility] Care Management will continue to provide services for this member through 12/30/20. The [facility] Care Management Team will assume care 12/31/2020.   Kathyrn Sheriff, RN, MSN, [facility] Chronic Care Management Coordinator [facility] [phone]

## 2020-11-30 DIAGNOSIS — M542 Cervicalgia: Secondary | ICD-10-CM | POA: Diagnosis not present

## 2020-11-30 DIAGNOSIS — Z79899 Other long term (current) drug therapy: Secondary | ICD-10-CM | POA: Diagnosis not present

## 2020-11-30 DIAGNOSIS — G8929 Other chronic pain: Secondary | ICD-10-CM | POA: Diagnosis not present

## 2020-12-05 DIAGNOSIS — I472 Ventricular tachycardia: Secondary | ICD-10-CM | POA: Diagnosis not present

## 2020-12-20 DIAGNOSIS — I472 Ventricular tachycardia: Secondary | ICD-10-CM | POA: Diagnosis not present

## 2020-12-20 DIAGNOSIS — Z01812 Encounter for preprocedural laboratory examination: Secondary | ICD-10-CM | POA: Diagnosis not present

## 2020-12-26 DIAGNOSIS — Z79899 Other long term (current) drug therapy: Secondary | ICD-10-CM | POA: Diagnosis not present

## 2020-12-26 DIAGNOSIS — Z95820 Peripheral vascular angioplasty status with implants and grafts: Secondary | ICD-10-CM | POA: Diagnosis not present

## 2020-12-26 DIAGNOSIS — Z7982 Long term (current) use of aspirin: Secondary | ICD-10-CM | POA: Diagnosis not present

## 2020-12-26 DIAGNOSIS — I10 Essential (primary) hypertension: Secondary | ICD-10-CM | POA: Diagnosis not present

## 2020-12-26 DIAGNOSIS — E039 Hypothyroidism, unspecified: Secondary | ICD-10-CM | POA: Diagnosis not present

## 2020-12-26 DIAGNOSIS — I472 Ventricular tachycardia: Secondary | ICD-10-CM | POA: Diagnosis not present

## 2020-12-26 DIAGNOSIS — I739 Peripheral vascular disease, unspecified: Secondary | ICD-10-CM | POA: Diagnosis not present

## 2020-12-26 DIAGNOSIS — Z87891 Personal history of nicotine dependence: Secondary | ICD-10-CM | POA: Diagnosis not present

## 2020-12-26 DIAGNOSIS — I2510 Atherosclerotic heart disease of native coronary artery without angina pectoris: Secondary | ICD-10-CM | POA: Diagnosis not present

## 2020-12-26 DIAGNOSIS — I779 Disorder of arteries and arterioles, unspecified: Secondary | ICD-10-CM | POA: Diagnosis not present

## 2020-12-26 DIAGNOSIS — Z951 Presence of aortocoronary bypass graft: Secondary | ICD-10-CM | POA: Diagnosis not present

## 2020-12-26 DIAGNOSIS — E785 Hyperlipidemia, unspecified: Secondary | ICD-10-CM | POA: Diagnosis not present

## 2020-12-26 DIAGNOSIS — Z7902 Long term (current) use of antithrombotics/antiplatelets: Secondary | ICD-10-CM | POA: Diagnosis not present

## 2020-12-26 HISTORY — PX: LOOP RECORDER IMPLANT: SHX5954

## 2021-01-02 DIAGNOSIS — M542 Cervicalgia: Secondary | ICD-10-CM | POA: Diagnosis not present

## 2021-01-02 DIAGNOSIS — G8929 Other chronic pain: Secondary | ICD-10-CM | POA: Diagnosis not present

## 2021-01-03 ENCOUNTER — Other Ambulatory Visit: Payer: Self-pay

## 2021-01-03 DIAGNOSIS — Z951 Presence of aortocoronary bypass graft: Secondary | ICD-10-CM | POA: Diagnosis not present

## 2021-01-03 DIAGNOSIS — I472 Ventricular tachycardia: Secondary | ICD-10-CM | POA: Diagnosis not present

## 2021-01-06 DIAGNOSIS — Z95818 Presence of other cardiac implants and grafts: Secondary | ICD-10-CM | POA: Diagnosis not present

## 2021-01-06 DIAGNOSIS — I472 Ventricular tachycardia: Secondary | ICD-10-CM | POA: Diagnosis not present

## 2021-02-02 DIAGNOSIS — G8929 Other chronic pain: Secondary | ICD-10-CM | POA: Diagnosis not present

## 2021-02-02 DIAGNOSIS — Z79899 Other long term (current) drug therapy: Secondary | ICD-10-CM | POA: Diagnosis not present

## 2021-02-02 DIAGNOSIS — M542 Cervicalgia: Secondary | ICD-10-CM | POA: Diagnosis not present

## 2021-02-02 DIAGNOSIS — R7303 Prediabetes: Secondary | ICD-10-CM | POA: Diagnosis not present

## 2021-02-02 DIAGNOSIS — E559 Vitamin D deficiency, unspecified: Secondary | ICD-10-CM | POA: Diagnosis not present

## 2021-02-02 DIAGNOSIS — E782 Mixed hyperlipidemia: Secondary | ICD-10-CM | POA: Diagnosis not present

## 2021-02-02 DIAGNOSIS — I472 Ventricular tachycardia: Secondary | ICD-10-CM | POA: Diagnosis not present

## 2021-02-13 ENCOUNTER — Ambulatory Visit: Payer: HMO | Admitting: Surgery

## 2021-02-13 ENCOUNTER — Other Ambulatory Visit: Payer: Self-pay

## 2021-02-13 ENCOUNTER — Ambulatory Visit (HOSPITAL_COMMUNITY)
Admission: RE | Admit: 2021-02-13 | Discharge: 2021-02-13 | Disposition: A | Discharge status: Home / Self Care | Payer: HMO | Source: Ambulatory Visit | Attending: Surgery | Admitting: Surgery

## 2021-02-13 ENCOUNTER — Encounter: Payer: Self-pay | Admitting: Surgery

## 2021-02-13 VITALS — BP 155/78 | HR 53 | Temp 97.6°F | Resp 20 | Ht 70.0 in | Wt 188.0 lb

## 2021-02-13 DIAGNOSIS — I6523 Occlusion and stenosis of bilateral carotid arteries: Secondary | ICD-10-CM | POA: Insufficient documentation

## 2021-02-13 DIAGNOSIS — I70213 Atherosclerosis of native arteries of extremities with intermittent claudication, bilateral legs: Secondary | ICD-10-CM | POA: Diagnosis not present

## 2021-02-13 NOTE — Progress Notes (Signed)
[facility] of [place]  Patient name: Joe Walls MRN: [id number] DOB: Apr 22, 1951 Sex: male   REASON FOR VISIT:    Follow up  HISOTRY OF PRESENT ILLNESS:   Joe Walls is a 70 y.o. male who is status post left TCAR on 7-16-201 for symptomatic stenosis.  He was found to have a sub-total occlusion which was stented.  His pre-operative symptoms were amaurosis.  He has not had any new symptoms since his surgery.  He continues to take his aspirin, Plavix, and statin.  He has a history of a right carotid stent placed in [place]    Patient has a history of tobacco abuse in the past. He has a history of premature cardiovascular disease in his father who had an MI at 74. The patient has a history of CABG in 2017. He is medically managed for hypertension with an ACE inhibitor.    PAST MEDICAL HISTORY:   Past Medical History:  Diagnosis Date  . CAD (coronary artery disease)   . High cholesterol   . Hypertension   . Renal disorder      FAMILY HISTORY:   Family History  Problem Relation Age of Onset  . Coronary artery disease Father 8       died of an MI    SOCIAL HISTORY:   Social History   Tobacco Use  . Smoking status: Former Smoker    Quit date: 05/21/1992    Years since quitting: 28.7  . Smokeless tobacco: Never Used  Substance Use Topics  . Alcohol use: Yes    Comment: beer every other day     ALLERGIES:   No Known Allergies   CURRENT MEDICATIONS:   Current Outpatient Medications  Medication Sig Dispense Refill  . amLODipine (NORVASC) 10 MG tablet Take 10 mg by mouth daily.    Marland Kitchen aspirin EC 81 MG tablet Take 81 mg by mouth daily.    Marland Kitchen atorvastatin (LIPITOR) 40 MG tablet Take 40 mg by mouth at bedtime.     . carvedilol (COREG) 3.125 MG tablet Take 3.125 mg by mouth 2 (two) times daily with a meal.    . clopidogrel (PLAVIX) 75 MG tablet Take 75 mg by mouth daily.     Marland Kitchen gabapentin  (NEURONTIN) 300 MG capsule Take 600 mg by mouth at bedtime.     Marland Kitchen levothyroxine (SYNTHROID) 100 MCG tablet Take 100 mcg by mouth at bedtime.     Marland Kitchen lisinopril (ZESTRIL) 5 MG tablet Take 5 mg by mouth daily.    . Multiple Vitamin (MULTIVITAMIN WITH MINERALS) TABS tablet Take 1 tablet by mouth daily.    . Multiple Vitamins-Minerals (CENTRUM SILVER PO) Take 1 tablet by mouth daily.    . NUCYNTA 100 MG TABS Take 100 mg by mouth 4 (four) times daily as needed (pain).     . prazosin (MINIPRESS) 5 MG capsule Take 5 mg by mouth at bedtime.    . sulfamethoxazole-trimethoprim (BACTRIM) 400-80 MG tablet Take 2 tablets by mouth 2 (two) times daily. 12 tablet 0  . nitroGLYCERIN (NITROSTAT) 0.4 MG SL tablet Place 1 tablet (0.4 mg total) under the tongue every 5 (five) minutes x 3 doses as needed for chest pain. 60 tablet 3   No current facility-administered medications for this visit.    REVIEW OF SYSTEMS:   [X]  denotes positive finding, [ ]  denotes negative finding Cardiac  Comments:  Chest pain or chest pressure:    Shortness of breath upon  exertion:    Short of breath when lying flat:    Irregular heart rhythm:        Vascular    Pain in calf, thigh, or hip brought on by ambulation:    Pain in feet at night that wakes you up from your sleep:     Blood clot in your veins:    Leg swelling:         Pulmonary    Oxygen at home:    Productive cough:     Wheezing:         Neurologic    Sudden weakness in arms or legs:     Sudden numbness in arms or legs:     Sudden onset of difficulty speaking or slurred speech:    Temporary loss of vision in one eye:     Problems with dizziness:         Gastrointestinal    Blood in stool:     Vomited blood:         Genitourinary    Burning when urinating:     Blood in urine:        Psychiatric    Major depression:         Hematologic    Bleeding problems:    Problems with blood clotting too easily:        Skin    Rashes or ulcers:         Constitutional    Fever or chills:      PHYSICAL EXAM:   Vitals:   02/13/21 0926 02/13/21 0929  BP: 132/73 (!) 155/78  Pulse: (!) 53   Resp: 20   Temp: 97.6 F (36.4 C)   SpO2: 95%   Weight: 188 lb (85.3 kg)   Height: 5\' 10"  (1.778 m)     GENERAL: The patient is a well-nourished male, in no acute distress. The vital signs are documented above. CARDIAC: There is a regular rate and rhythm.  VASCULAR: Palpable pedal pulses PULMONARY: Non-labored respirations ABDOMEN: Soft and non-tender with normal pitched bowel sounds.  MUSCULOSKELETAL: There are no major deformities or cyanosis. NEUROLOGIC: No focal weakness or paresthesias are detected. SKIN: There are no ulcers or rashes noted. PSYCHIATRIC: The patient has a normal affect.  STUDIES:   I have reviewed the following duplex:  Right Carotid: 50-75% stenosis proximal right ICA stent.   Left Carotid: The ECA appears >50% stenosed. Patent Left ICA stent with no        stenosis.   MEDICAL ISSUES:   Carotid: He has a stable 50-75% stenosis in the right carotid stent.  The left side remains widely patent.  He will return in 6 months for repeat duplex.    , MD, FACS [facility] [phone] Pager [phone]

## 2021-02-14 ENCOUNTER — Other Ambulatory Visit: Payer: Self-pay

## 2021-02-14 DIAGNOSIS — I6523 Occlusion and stenosis of bilateral carotid arteries: Secondary | ICD-10-CM

## 2021-02-21 DIAGNOSIS — E039 Hypothyroidism, unspecified: Secondary | ICD-10-CM | POA: Diagnosis not present

## 2021-02-21 DIAGNOSIS — Z95818 Presence of other cardiac implants and grafts: Secondary | ICD-10-CM | POA: Diagnosis not present

## 2021-02-21 DIAGNOSIS — Z9889 Other specified postprocedural states: Secondary | ICD-10-CM | POA: Diagnosis not present

## 2021-02-21 DIAGNOSIS — I25718 Atherosclerosis of autologous vein coronary artery bypass graft(s) with other forms of angina pectoris: Secondary | ICD-10-CM | POA: Diagnosis not present

## 2021-02-21 DIAGNOSIS — I10 Essential (primary) hypertension: Secondary | ICD-10-CM | POA: Diagnosis not present

## 2021-02-21 DIAGNOSIS — Z951 Presence of aortocoronary bypass graft: Secondary | ICD-10-CM | POA: Diagnosis not present

## 2021-02-21 DIAGNOSIS — G4733 Obstructive sleep apnea (adult) (pediatric): Secondary | ICD-10-CM | POA: Diagnosis not present

## 2021-02-21 DIAGNOSIS — I472 Ventricular tachycardia: Secondary | ICD-10-CM | POA: Diagnosis not present

## 2021-03-02 DIAGNOSIS — R7303 Prediabetes: Secondary | ICD-10-CM | POA: Diagnosis not present

## 2021-03-02 DIAGNOSIS — E559 Vitamin D deficiency, unspecified: Secondary | ICD-10-CM | POA: Diagnosis not present

## 2021-03-02 DIAGNOSIS — M542 Cervicalgia: Secondary | ICD-10-CM | POA: Diagnosis not present

## 2021-03-02 DIAGNOSIS — G8929 Other chronic pain: Secondary | ICD-10-CM | POA: Diagnosis not present

## 2021-03-30 DIAGNOSIS — Z9889 Other specified postprocedural states: Secondary | ICD-10-CM | POA: Diagnosis not present

## 2021-04-03 DIAGNOSIS — G8929 Other chronic pain: Secondary | ICD-10-CM | POA: Diagnosis not present

## 2021-04-03 DIAGNOSIS — M542 Cervicalgia: Secondary | ICD-10-CM | POA: Diagnosis not present

## 2021-04-03 DIAGNOSIS — I10 Essential (primary) hypertension: Secondary | ICD-10-CM | POA: Diagnosis not present

## 2021-05-01 DIAGNOSIS — M542 Cervicalgia: Secondary | ICD-10-CM | POA: Diagnosis not present

## 2021-05-01 DIAGNOSIS — G8929 Other chronic pain: Secondary | ICD-10-CM | POA: Diagnosis not present

## 2021-06-01 DIAGNOSIS — G8929 Other chronic pain: Secondary | ICD-10-CM | POA: Diagnosis not present

## 2021-06-01 DIAGNOSIS — E559 Vitamin D deficiency, unspecified: Secondary | ICD-10-CM | POA: Diagnosis not present

## 2021-06-01 DIAGNOSIS — R0989 Other specified symptoms and signs involving the circulatory and respiratory systems: Secondary | ICD-10-CM | POA: Diagnosis not present

## 2021-06-01 DIAGNOSIS — R7303 Prediabetes: Secondary | ICD-10-CM | POA: Diagnosis not present

## 2021-06-01 DIAGNOSIS — M79605 Pain in left leg: Secondary | ICD-10-CM | POA: Diagnosis not present

## 2021-06-01 DIAGNOSIS — M542 Cervicalgia: Secondary | ICD-10-CM | POA: Diagnosis not present

## 2021-06-01 DIAGNOSIS — E782 Mixed hyperlipidemia: Secondary | ICD-10-CM | POA: Diagnosis not present

## 2021-06-01 DIAGNOSIS — Z79899 Other long term (current) drug therapy: Secondary | ICD-10-CM | POA: Diagnosis not present

## 2021-06-01 DIAGNOSIS — M79604 Pain in right leg: Secondary | ICD-10-CM | POA: Diagnosis not present

## 2021-06-28 DIAGNOSIS — R002 Palpitations: Secondary | ICD-10-CM | POA: Diagnosis not present

## 2021-06-30 DIAGNOSIS — Z79899 Other long term (current) drug therapy: Secondary | ICD-10-CM | POA: Diagnosis not present

## 2021-06-30 DIAGNOSIS — G8929 Other chronic pain: Secondary | ICD-10-CM | POA: Diagnosis not present

## 2021-06-30 DIAGNOSIS — M542 Cervicalgia: Secondary | ICD-10-CM | POA: Diagnosis not present

## 2021-07-31 DIAGNOSIS — G8929 Other chronic pain: Secondary | ICD-10-CM | POA: Diagnosis not present

## 2021-07-31 DIAGNOSIS — I10 Essential (primary) hypertension: Secondary | ICD-10-CM | POA: Diagnosis not present

## 2021-07-31 DIAGNOSIS — Z0000 Encounter for general adult medical examination without abnormal findings: Secondary | ICD-10-CM | POA: Diagnosis not present

## 2021-07-31 DIAGNOSIS — Z79899 Other long term (current) drug therapy: Secondary | ICD-10-CM | POA: Diagnosis not present

## 2021-07-31 DIAGNOSIS — F32A Depression, unspecified: Secondary | ICD-10-CM | POA: Diagnosis not present

## 2021-07-31 DIAGNOSIS — M542 Cervicalgia: Secondary | ICD-10-CM | POA: Diagnosis not present

## 2021-08-03 DIAGNOSIS — Z79899 Other long term (current) drug therapy: Secondary | ICD-10-CM | POA: Diagnosis not present

## 2021-08-23 DIAGNOSIS — I6529 Occlusion and stenosis of unspecified carotid artery: Secondary | ICD-10-CM | POA: Diagnosis not present

## 2021-08-23 DIAGNOSIS — I25718 Atherosclerosis of autologous vein coronary artery bypass graft(s) with other forms of angina pectoris: Secondary | ICD-10-CM | POA: Diagnosis not present

## 2021-08-23 DIAGNOSIS — I517 Cardiomegaly: Secondary | ICD-10-CM | POA: Diagnosis not present

## 2021-08-31 DIAGNOSIS — Z79899 Other long term (current) drug therapy: Secondary | ICD-10-CM | POA: Diagnosis not present

## 2021-08-31 DIAGNOSIS — G8929 Other chronic pain: Secondary | ICD-10-CM | POA: Diagnosis not present

## 2021-08-31 DIAGNOSIS — M542 Cervicalgia: Secondary | ICD-10-CM | POA: Diagnosis not present

## 2021-08-31 DIAGNOSIS — G250 Essential tremor: Secondary | ICD-10-CM | POA: Diagnosis not present

## 2021-09-04 DIAGNOSIS — Z79899 Other long term (current) drug therapy: Secondary | ICD-10-CM | POA: Diagnosis not present

## 2021-09-26 DIAGNOSIS — Z9889 Other specified postprocedural states: Secondary | ICD-10-CM | POA: Diagnosis not present

## 2021-09-29 DIAGNOSIS — E559 Vitamin D deficiency, unspecified: Secondary | ICD-10-CM | POA: Diagnosis not present

## 2021-09-29 DIAGNOSIS — R7303 Prediabetes: Secondary | ICD-10-CM | POA: Diagnosis not present

## 2021-09-29 DIAGNOSIS — G8929 Other chronic pain: Secondary | ICD-10-CM | POA: Diagnosis not present

## 2021-09-29 DIAGNOSIS — E782 Mixed hyperlipidemia: Secondary | ICD-10-CM | POA: Diagnosis not present

## 2021-09-29 DIAGNOSIS — M542 Cervicalgia: Secondary | ICD-10-CM | POA: Diagnosis not present

## 2021-09-29 DIAGNOSIS — Z23 Encounter for immunization: Secondary | ICD-10-CM | POA: Diagnosis not present

## 2021-09-29 DIAGNOSIS — Z79899 Other long term (current) drug therapy: Secondary | ICD-10-CM | POA: Diagnosis not present

## 2021-10-06 DIAGNOSIS — Z79899 Other long term (current) drug therapy: Secondary | ICD-10-CM | POA: Diagnosis not present

## 2021-10-23 ENCOUNTER — Ambulatory Visit: Payer: HMO | Admitting: Surgery

## 2021-10-23 ENCOUNTER — Other Ambulatory Visit: Payer: Self-pay

## 2021-10-23 ENCOUNTER — Encounter: Payer: Self-pay | Admitting: Surgery

## 2021-10-23 ENCOUNTER — Ambulatory Visit (HOSPITAL_COMMUNITY)
Admission: RE | Admit: 2021-10-23 | Discharge: 2021-10-23 | Disposition: A | Discharge status: Home / Self Care | Payer: HMO | Source: Ambulatory Visit | Attending: Surgery | Admitting: Surgery

## 2021-10-23 VITALS — BP 118/72 | HR 51 | Temp 97.9°F | Resp 20 | Ht 70.0 in | Wt 185.0 lb

## 2021-10-23 DIAGNOSIS — I6523 Occlusion and stenosis of bilateral carotid arteries: Secondary | ICD-10-CM

## 2021-10-23 NOTE — Progress Notes (Signed)
Vascular and Vein Specialist of [place]  Patient name: Joe Walls MRN: [id number] DOB: 14-Aug-1951 Sex: male   REASON FOR VISIT:    Follow up  HISOTRY OF PRESENT ILLNESS:   [name] is a 70 y.o. male who is status post left TCAR on 7-16-201 for symptomatic stenosis.  He was found to have a sub-total occlusion which was stented.  His pre-operative symptoms were amaurosis.  He has not had any new symptoms since his surgery.  He continues to take his aspirin, Plavix, and statin.  He has a history of a right carotid stent placed in [place].  He denies any neurologic symptoms.  Specifically denies numbness or weakness in either extremity.  He denies amaurosis fugax.       Patient has a history of tobacco abuse in the past.  He has a history of premature cardiovascular disease in his father who had an MI at 76.  The patient has a history of CABG in 2017.  He is medically managed for hypertension with an ACE inhibitor.     PAST MEDICAL HISTORY:   Past Medical History:  Diagnosis Date   CAD (coronary artery disease)    High cholesterol    Hypertension    Renal disorder      FAMILY HISTORY:   Family History  Problem Relation Age of Onset   Coronary artery disease Father 20       died of an MI    SOCIAL HISTORY:   Social History   Tobacco Use   Smoking status: Former    Types: Cigarettes    Quit date: 05/21/1992    Years since quitting: 29.4   Smokeless tobacco: Never  Substance Use Topics   Alcohol use: Yes    Comment: beer every other day     ALLERGIES:   No Known Allergies   CURRENT MEDICATIONS:   Current Outpatient Medications  Medication Sig Dispense Refill   amLODipine (NORVASC) 10 MG tablet Take 10 mg by mouth daily.     aspirin EC 81 MG tablet Take 81 mg by mouth daily.     atorvastatin (LIPITOR) 40 MG tablet Take 40 mg by mouth at bedtime.      carvedilol (COREG) 3.125 MG tablet Take 3.125 mg by  mouth 2 (two) times daily with a meal.     clopidogrel (PLAVIX) 75 MG tablet Take 75 mg by mouth daily.      gabapentin (NEURONTIN) 300 MG capsule Take 600 mg by mouth at bedtime.      levothyroxine (SYNTHROID) 100 MCG tablet Take 100 mcg by mouth at bedtime.      lisinopril (ZESTRIL) 5 MG tablet Take 5 mg by mouth daily.     Multiple Vitamin (MULTIVITAMIN WITH MINERALS) TABS tablet Take 1 tablet by mouth daily.     Multiple Vitamins-Minerals (CENTRUM SILVER PO) Take 1 tablet by mouth daily.     NUCYNTA 100 MG TABS Take 100 mg by mouth 4 (four) times daily as needed (pain).      prazosin (MINIPRESS) 5 MG capsule Take 5 mg by mouth at bedtime.     nitroGLYCERIN (NITROSTAT) 0.4 MG SL tablet Place 1 tablet (0.4 mg total) under the tongue every 5 (five) minutes x 3 doses as needed for chest pain. 60 tablet 3   No current facility-administered medications for this visit.    REVIEW OF SYSTEMS:   [X]  denotes positive finding, [ ]  denotes negative finding Cardiac  Comments:  Chest  pain or chest pressure:    Shortness of breath upon exertion:    Short of breath when lying flat:    Irregular heart rhythm:        Vascular    Pain in calf, thigh, or hip brought on by ambulation:    Pain in feet at night that wakes you up from your sleep:     Blood clot in your veins:    Leg swelling:         Pulmonary    Oxygen at home:    Productive cough:     Wheezing:         Neurologic    Sudden weakness in arms or legs:     Sudden numbness in arms or legs:     Sudden onset of difficulty speaking or slurred speech:    Temporary loss of vision in one eye:     Problems with dizziness:         Gastrointestinal    Blood in stool:     Vomited blood:         Genitourinary    Burning when urinating:     Blood in urine:        Psychiatric    Major depression:         Hematologic    Bleeding problems:    Problems with blood clotting too easily:        Skin    Rashes or ulcers:         Constitutional    Fever or chills:      PHYSICAL EXAM:   Vitals:   10/23/21 1147 10/23/21 1148  BP: 126/67 118/72  Pulse: (!) 51   Resp: 20   Temp: 97.9 F (36.6 C)   SpO2: 97%   Weight: 83.9 kg   Height: 5\' 10"  (1.778 m)     GENERAL: The patient is a well-nourished male, in no acute distress. The vital signs are documented above. CARDIAC: There is a regular rate and rhythm.  PULMONARY: Non-labored respirations MUSCULOSKELETAL: There are no major deformities or cyanosis. NEUROLOGIC: No focal weakness or paresthesias are detected. SKIN: There are no ulcers or rashes noted. PSYCHIATRIC: The patient has a normal affect.  STUDIES:   I have reviewed his carotid duplex with the following results: Right Carotid: Velocities in the right ICA are consistent with a 1-39%  stenosis.                 Non-hemodynamically significant plaque <50% noted in the  CCA. The                 ECA appears >50% stenosed. 50-75% ICA stent stenosis.   Left Carotid: Velocities in the left ICA are consistent with a 1-39%  stenosis.                Non-hemodynamically significant plaque <50% noted in the  CCA.   Vertebrals:  Bilateral vertebral arteries demonstrate antegrade flow.  Subclavians: Normal flow hemodynamics were seen in bilateral subclavian               arteries.   MEDICAL ISSUES:   The left-sided stent remains widely patent.  There is a stable 50-75% stenosis on the right.  I discussed that we would continue every 64-month carotid duplex imaging.  Once the stenosis becomes greater than 80%, he would need a CT scan for procedure planning.    8-month, MD, FACS [facility]  Tel [phone] Pager [phone]

## 2021-10-27 ENCOUNTER — Other Ambulatory Visit: Payer: Self-pay

## 2021-10-27 DIAGNOSIS — I6523 Occlusion and stenosis of bilateral carotid arteries: Secondary | ICD-10-CM

## 2021-10-30 DIAGNOSIS — Z79899 Other long term (current) drug therapy: Secondary | ICD-10-CM | POA: Diagnosis not present

## 2021-10-31 DIAGNOSIS — K4090 Unilateral inguinal hernia, without obstruction or gangrene, not specified as recurrent: Secondary | ICD-10-CM | POA: Diagnosis not present

## 2021-11-14 DIAGNOSIS — I517 Cardiomegaly: Secondary | ICD-10-CM | POA: Diagnosis not present

## 2021-11-14 DIAGNOSIS — I4729 Other ventricular tachycardia: Secondary | ICD-10-CM | POA: Diagnosis not present

## 2021-11-14 DIAGNOSIS — I493 Ventricular premature depolarization: Secondary | ICD-10-CM | POA: Diagnosis not present

## 2021-11-29 DIAGNOSIS — Z79899 Other long term (current) drug therapy: Secondary | ICD-10-CM | POA: Diagnosis not present

## 2021-11-29 DIAGNOSIS — M542 Cervicalgia: Secondary | ICD-10-CM | POA: Diagnosis not present

## 2021-11-29 DIAGNOSIS — E559 Vitamin D deficiency, unspecified: Secondary | ICD-10-CM | POA: Diagnosis not present

## 2021-11-29 DIAGNOSIS — G8929 Other chronic pain: Secondary | ICD-10-CM | POA: Diagnosis not present

## 2021-12-01 DIAGNOSIS — Z79899 Other long term (current) drug therapy: Secondary | ICD-10-CM | POA: Diagnosis not present

## 2022-01-30 NOTE — Progress Notes (Signed)
Surgical Instructions    Your procedure is scheduled on Friday, February 10th, 2023.   Report to [facility] Main Entrance "A" at 05:30 A.M., then check in with the Admitting office.  Call this number if you have problems the morning of surgery:  [phone]   If you have any questions prior to your surgery date call [phone]: Open Monday-Friday 8am-4pm    Remember:  Do not eat after midnight the night before your surgery  You may drink clear liquids until 04:30 the morning of your surgery.   Clear liquids allowed are: Water, Non-Citrus Juices (without pulp), Carbonated Beverages, Clear Tea, Black Coffee ONLY (NO MILK, CREAM OR POWDERED CREAMER of any kind), and Gatorade    Take these medicines the morning of surgery with A SIP OF WATER:   amLODipine (NORVASC) carvedilol (COREG) famotidine (PEPCID) omeprazole (PRILOSEC)  primidone (MYSOLINE)  If needed:  nitroGLYCERIN (NITROSTAT)  NUCYNTA   Follow your surgeon's instructions on when to stop Aspirin and Plavix.  If no instructions were given by your surgeon then you will need to call the office to get those instructions.     As of today, STOP taking any Aspirin (unless otherwise instructed by your surgeon) Aleve, Naproxen, Ibuprofen, Motrin, Advil, Goody's, BC's, all herbal medications, fish oil, and all vitamins.    The day of surgery:          Do not wear jewelry  Do not wear lotions, powders, colognes, or deodorant. Men may shave face and neck. Do not bring valuables to the hospital.              [facility] is not responsible for any belongings or valuables.  Do NOT Smoke (Tobacco/Vaping)  24 hours prior to your procedure  If you use a CPAP at night, you may bring your mask for your overnight stay.   Contacts, glasses, hearing aids, dentures or partials may not be worn into surgery, please bring cases for these belongings   For patients admitted to the hospital, discharge time will be determined by your  treatment team.   Patients discharged the day of surgery will not be allowed to drive home, and someone needs to stay with them for 24 hours.  NO VISITORS WILL BE ALLOWED IN PRE-OP WHERE PATIENTS ARE PREPPED FOR SURGERY.  ONLY 1 SUPPORT PERSON MAY BE PRESENT IN THE WAITING ROOM WHILE YOU ARE IN SURGERY.  IF YOU ARE TO BE ADMITTED, ONCE YOU ARE IN YOUR ROOM YOU WILL BE ALLOWED TWO (2) VISITORS. 1 (ONE) VISITOR MAY STAY OVERNIGHT BUT MUST ARRIVE TO THE ROOM BY 8pm.  Minor children may have two parents present. Special consideration for safety and communication needs will be reviewed on a case by case basis.  Special instructions:    Oral Hygiene is also important to reduce your risk of infection.  Remember - BRUSH YOUR TEETH THE MORNING OF SURGERY WITH YOUR REGULAR TOOTHPASTE   [facility]- Preparing For Surgery  Before surgery, you can play an important role. Because skin is not sterile, your skin needs to be as free of germs as possible. You can reduce the number of germs on your skin by washing with CHG (chlorahexidine gluconate) Soap before surgery.  CHG is an antiseptic cleaner which kills germs and bonds with the skin to continue killing germs even after washing.     Please do not use if you have an allergy to CHG or antibacterial soaps. If your skin becomes reddened/irritated stop using the CHG.  Do not shave (including legs and underarms) for at least 48 hours prior to first CHG shower. It is OK to shave your face.  Please follow these instructions carefully.     Shower the NIGHT BEFORE SURGERY and the MORNING OF SURGERY with CHG Soap.   If you chose to wash your hair, wash your hair first as usual with your normal shampoo. After you shampoo, rinse your hair and body thoroughly to remove the shampoo.  Then [employer] and genitals (private parts) with your normal soap and rinse thoroughly to remove soap.  After that Use CHG Soap as you would any other liquid soap. You can apply CHG  directly to the skin and wash gently with a scrungie or a clean washcloth.   Apply the CHG Soap to your body ONLY FROM THE NECK DOWN.  Do not use on open wounds or open sores. Avoid contact with your eyes, ears, mouth and genitals (private parts). Wash Face and genitals (private parts)  with your normal soap.   Wash thoroughly, paying special attention to the area where your surgery will be performed.  Thoroughly rinse your body with warm water from the neck down.  DO NOT shower/wash with your normal soap after using and rinsing off the CHG Soap.  Pat yourself dry with a CLEAN TOWEL.  Wear CLEAN PAJAMAS to bed the night before surgery  Place CLEAN SHEETS on your bed the night before your surgery  DO NOT SLEEP WITH PETS.   Day of Surgery:  Take a shower with CHG soap. Wear Clean/Comfortable clothing the morning of surgery Do not apply any deodorants/lotions.   Remember to brush your teeth WITH YOUR REGULAR TOOTHPASTE.   Please read over the following fact sheets that you were given.

## 2022-01-31 ENCOUNTER — Other Ambulatory Visit: Payer: Self-pay

## 2022-01-31 ENCOUNTER — Ambulatory Visit: Payer: Self-pay | Admitting: Surgery

## 2022-01-31 ENCOUNTER — Encounter (HOSPITAL_COMMUNITY)
Admission: RE | Admit: 2022-01-31 | Discharge: 2022-01-31 | Disposition: A | Discharge status: Home / Self Care | Payer: HMO | Source: Ambulatory Visit | Attending: Surgery | Admitting: Surgery

## 2022-01-31 ENCOUNTER — Encounter (HOSPITAL_COMMUNITY): Payer: Self-pay

## 2022-01-31 VITALS — BP 130/71 | HR 52 | Temp 98.4°F | Resp 17 | Ht 70.0 in | Wt 182.2 lb

## 2022-01-31 DIAGNOSIS — E7800 Pure hypercholesterolemia, unspecified: Secondary | ICD-10-CM | POA: Insufficient documentation

## 2022-01-31 DIAGNOSIS — I2510 Atherosclerotic heart disease of native coronary artery without angina pectoris: Secondary | ICD-10-CM | POA: Diagnosis not present

## 2022-01-31 DIAGNOSIS — Z955 Presence of coronary angioplasty implant and graft: Secondary | ICD-10-CM | POA: Insufficient documentation

## 2022-01-31 DIAGNOSIS — K4090 Unilateral inguinal hernia, without obstruction or gangrene, not specified as recurrent: Secondary | ICD-10-CM | POA: Insufficient documentation

## 2022-01-31 DIAGNOSIS — N182 Chronic kidney disease, stage 2 (mild): Secondary | ICD-10-CM | POA: Diagnosis not present

## 2022-01-31 DIAGNOSIS — I129 Hypertensive chronic kidney disease with stage 1 through stage 4 chronic kidney disease, or unspecified chronic kidney disease: Secondary | ICD-10-CM | POA: Insufficient documentation

## 2022-01-31 DIAGNOSIS — I4729 Other ventricular tachycardia: Secondary | ICD-10-CM | POA: Insufficient documentation

## 2022-01-31 DIAGNOSIS — I6529 Occlusion and stenosis of unspecified carotid artery: Secondary | ICD-10-CM | POA: Diagnosis not present

## 2022-01-31 DIAGNOSIS — Z01818 Encounter for other preprocedural examination: Secondary | ICD-10-CM | POA: Diagnosis not present

## 2022-01-31 DIAGNOSIS — G4733 Obstructive sleep apnea (adult) (pediatric): Secondary | ICD-10-CM | POA: Diagnosis not present

## 2022-01-31 DIAGNOSIS — Z87891 Personal history of nicotine dependence: Secondary | ICD-10-CM | POA: Insufficient documentation

## 2022-01-31 DIAGNOSIS — R7303 Prediabetes: Secondary | ICD-10-CM | POA: Diagnosis not present

## 2022-01-31 HISTORY — DX: Disorder of arteries and arterioles, unspecified: I77.9

## 2022-01-31 HISTORY — DX: Cardiac arrhythmia, unspecified: I49.9

## 2022-01-31 HISTORY — DX: Hypothyroidism, unspecified: E03.9

## 2022-01-31 HISTORY — DX: Sleep apnea, unspecified: G47.30

## 2022-01-31 HISTORY — DX: Prediabetes: R73.03

## 2022-01-31 LAB — CBC
HCT: 42.8 % (ref 39.0–52.0)
Hemoglobin: 14 g/dL (ref 13.0–17.0)
MCH: 31.8 pg (ref 26.0–34.0)
MCHC: 32.7 g/dL (ref 30.0–36.0)
MCV: 97.3 fL (ref 80.0–100.0)
Platelets: 191 10*3/uL (ref 150–400)
RBC: 4.4 MIL/uL (ref 4.22–5.81)
RDW: 12.5 % (ref 11.5–15.5)
WBC: 5.4 10*3/uL (ref 4.0–10.5)
nRBC: 0 % (ref 0.0–0.2)

## 2022-01-31 LAB — BASIC METABOLIC PANEL
Anion gap: 7 (ref 5–15)
BUN: 11 mg/dL (ref 8–23)
CO2: 31 mmol/L (ref 22–32)
Calcium: 9.4 mg/dL (ref 8.9–10.3)
Chloride: 103 mmol/L (ref 98–111)
Creatinine, Ser: 1.28 mg/dL — ABNORMAL HIGH (ref 0.61–1.24)
GFR, Estimated: 59 mL/min — ABNORMAL LOW (ref >60)
Glucose, Bld: 100 mg/dL — ABNORMAL HIGH (ref 70–99)
Potassium: 3.6 mmol/L (ref 3.5–5.1)
Sodium: 141 mmol/L (ref 135–145)

## 2022-01-31 LAB — GLUCOSE, CAPILLARY: Glucose-Capillary: 88 mg/dL (ref 70–99)

## 2022-01-31 NOTE — Progress Notes (Signed)
PCP - Sandi Mariscal, MD Cardiologist - Nicanor Alcon, MD    PPM/ICD - denies Device Orders - n/a Rep Notified - n/a Loop Recorder - yes  Chest x-ray - n/a EKG - 01/31/2022 Stress Test - 05/25/2020 ECHO - 10/16/2019 Cardiac Cath - 07/15/2020  Sleep Study - yes, positive for OSA CPAP - yes  Fasting Blood Sugar - pre-diabetic - patient is not checking CBG at home CBG today - 88  Blood Thinner Instructions: Patient on Aspirin and Plavix, verbalized that he stopped taking these few weeks ago for a procedure. Patient was instructed to call MD and ask if he needs to continue to hold these medicines for surgery or not. Patient verbalized understanding.  Patient was instructed: As of today, STOP taking any Aspirin (unless otherwise instructed by your surgeon) Aleve, Naproxen, Ibuprofen, Motrin, Advil, Goody's, BC's, all herbal medications, fish oil, and all vitamins.  ERAS Protcol - yes PRE-SURGERY Ensure or G2- n/a  COVID TEST- no - ambulatory surgery   Anesthesia review: yes - cardiac history; loop recorder  Patient denies shortness of breath, fever, cough and chest pain at PAT appointment   All instructions explained to the patient, with a verbal understanding of the material. Patient agrees to go over the instructions while at home for a better understanding. Patient also instructed to self quarantine after being tested for COVID-19. The opportunity to ask questions was provided.

## 2022-02-01 ENCOUNTER — Encounter (HOSPITAL_COMMUNITY): Payer: Self-pay

## 2022-02-01 NOTE — Progress Notes (Addendum)
Anesthesia Chart Review:  Case: [id number] Date/Time: 02/09/22 0715   Procedure: LEFT INGUINAL HERNIA REPAIR WITH MESH (Left)   Anesthesia type: General   Pre-op diagnosis: LEFT INGUINAL HERNIA   [facility] ROOM [facility] / [facility] OR   Surgeons: Jesusita Oka, MD       DISCUSSION: Patient is a 71 year old male scheduled for the above procedure.  History includes former smoker (quit 05/21/92), CAD (DES pLAD 10/24/15; s/p CABG 03/06/16: LIMA-LAD, SVG-OM1, SVG-RAMUS INT, SVG-RCA by Claria Dice, MD), HTN, hypercholesterolemia, pre-diabetes, carotid artery stenosis (s/p right ICA stenting 03/12/16 by Bishop Limbo, DO; s/p left TCAR for symptomatic stenosis 07/15/20), OSA (uses CPAP), CKD (stage II). He also has a history of "episodes of long episodes of NSVT and he underwent EP study on 12/26/20 with Dr. Minna Merritts that was negative for NSVT and SVT so loop recorder was implanted."  Loop recorder transmission 12/25/21 ([facility]): Presenting Rhythm: NSR @ 100bpm Device Findings: No events   Per posting, cardiac clearance received, and patient to hold "blood thinner" for 5 days prior to surgery. Of note, on 01/31/22 patient said his ASA and Plavix are already on hold due to previous procedure. He was advised to clarify instructions since surgery not until 02/09/22. I also notified Claiborne Billings at [facility] that patient had been advised to contact their office to clarify perioperative ASA and Plavix instructions. She will have Dr. Craig Guess MA work on faxing clearance.  Anesthesia team to evaluate on the day of surgery.    VS: BP 130/71    Pulse (!) 52    Temp 36.9 C (Oral)    Resp 17    Ht 5\' 10"  (1.778 m)    Wt 82.6 kg    SpO2 100%    BMI 26.14 kg/m    PROVIDERS: Sandi Mariscal, MD is PCP [facility]) - Nicanor Alcon, MD is cardiologist ([facility] [place]). Last visit 08/23/21 with six month follow-up planned. He has or is also still seeing Lawson Radar, MD at [facility]. Mahala Menghini, MD  is EP cardiologist ([facility]). Last visit 02/21/21 with Charise Killian, NP. One year follow-up planned.  Serafina Mitchell, MD is vascular surgeon. Last visit 10/23/21. LICA widely patent and stable 50-75% RICA stenosis. Six month follow-up US imaging planned.    LABS: Labs reviewed: Acceptable for surgery. (all labs ordered are listed, but only abnormal results are displayed)  Labs Reviewed  BASIC METABOLIC PANEL - Abnormal; Notable for the following components:      Result Value   Glucose, Bld 100 (*)    Creatinine, Ser 1.28 (*)    GFR, Estimated 59 (*)    All other components within normal limits  GLUCOSE, CAPILLARY  CBC     EKG: 01/31/22:  Sinus bradycardia Incomplete left bundle branch block Borderline ECG - Incomplete LBBB is old.   CV: US Carotid 10/23/21: Summary:  - Right Carotid: Velocities in the right ICA are consistent with a 1-39% stenosis. Non-hemodynamically significant plaque <50% noted in the  CCA. The ECA appears >50% stenosed. 50-75% ICA stent stenosis.  - Left Carotid: Velocities in the left ICA are consistent with a 1-39% stenosis. Non-hemodynamically significant plaque <50% noted in the CCA.  - Vertebrals:  Bilateral vertebral arteries demonstrate antegrade flow.  - Subclavians: Normal flow hemodynamics were seen in bilateral subclavian arteries.    EP Study 12/26/20 ([facility]): Impression:  - Electrophysiology study that was both negative for ventricular tachycardia and supraventricular tachycardia. -  Recommendations: After termination of procedure.  All cath and sheath were removed.  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 - Status post [employer] loop monitor placement    Holter Monitor ~ 08/2020: Per 11/04/20 [facility] New Patient evaluation note, "His cardiologist Dr. Claudie Leach obtain a event monitor that showed that he  has multiple episodes of nonsustained ventricular tachycardia longest is 14-16 beat."   Nuclear stress test 05/25/20 ([facility]): IMPRESSION:  1. No reversible ischemia or infarction.  2. Normal left ventricular wall motion.  3. Left ventricular ejection fraction 67%  4. Non invasive risk stratification*: Low  2012 Appropriate Use Criteria for Coronary Revascularization  Focused Update: J Am Coll Cardiol. [id number].  http://content.airportbarriers.com.aspx?articleid=[email or web address]     Echo 05/25/20 ([facility]): SUMMARY  There is moderate concentric left ventricular hypertrophy.  The left ventricular wall motion is normal.  Left ventricular systolic function is normal.  LV ejection fraction = 65-70%.  The left atrium is mildly dilated.  The mitral valve leaflets appear normal with mild mitral  regurgitation.  RVSP not able to be calculated.  There is no comparison study available.    Cardiac cath 07/16/16 ([facility]): Diagnostic Procedure Summary  1. Severe Native CAD  2. All grafts are patent  3. LV function is normal  Diagnostic Procedure Recommendations  Medical therapy for CAD.    Past Medical History:  Diagnosis Date   CAD (coronary artery disease)    Carotid artery disease (HCC)    Dysrhythmia    NSVT, s/p "negative" EP study 12/26/20 by Dr. Elveria Royals in [facility], loop recorder placed   High cholesterol    Hypertension    Hypothyroidism    Pre-diabetes    Renal disorder    Sleep apnea     Past Surgical History:  Procedure Laterality Date   CARDIAC CATHETERIZATION     CAROTID STENT Right 03/12/2016   by Bishop Limbo, DO in [place] IMPLANT  12/26/2020   TRANSCAROTID ARTERY REVASCULARIZATION  Left 07/15/2020   Procedure: LEFT TRANSCAROTID ARTERY REVASCULARIZATION;  Surgeon: Serafina Mitchell, MD;  Location: [facility] OR;  Service: Vascular;  Laterality: Left;   ULTRASOUND GUIDANCE FOR VASCULAR  ACCESS Left 07/15/2020   Procedure: ULTRASOUND GUIDANCE FOR VASCULAR ACCESS;  Surgeon: Serafina Mitchell, MD;  Location: [facility] OR;  Service: Vascular;  Laterality: Left;   UPPER EXTREMITY ANGIOGRAM Left 07/15/2020   Procedure: CAROTID ARTERY ANGIOGRAM;  Surgeon: Serafina Mitchell, MD;  Location: [facility] OR;  Service: Vascular;  Laterality: Left;    MEDICATIONS:  amLODipine (NORVASC) 10 MG tablet   aspirin EC 81 MG tablet   atorvastatin (LIPITOR) 40 MG tablet   carvedilol (COREG) 6.25 MG tablet   clopidogrel (PLAVIX) 75 MG tablet   famotidine (PEPCID) 20 MG tablet   gabapentin (NEURONTIN) 300 MG capsule   levothyroxine (SYNTHROID) 100 MCG tablet   lisinopril (ZESTRIL) 5 MG tablet   Multiple Vitamin (MULTIVITAMIN WITH MINERALS) TABS tablet   nitroGLYCERIN (NITROSTAT) 0.4 MG SL tablet   NUCYNTA 100 MG TABS   omeprazole (PRILOSEC) 40 MG capsule   prazosin (MINIPRESS) 5 MG capsule   primidone (MYSOLINE) 50 MG tablet   No current facility-administered medications for this encounter.    Myra Gianotti, PA-C Surgical Short Stay/Anesthesiology  [facility] Phone [phone] [facility] Phone [phone] 02/02/2022 10:08 AM

## 2022-02-01 NOTE — Anesthesia Preprocedure Evaluation (Addendum)
Anesthesia Evaluation  Patient identified by MRN, date of birth, ID band Patient awake    Reviewed: Allergy & Precautions, NPO status , Patient's Chart, lab work & pertinent test results  Airway Mallampati: II  TM Distance: >3 FB     Dental   Pulmonary sleep apnea , former smoker,    breath sounds clear to auscultation       Cardiovascular hypertension, + CAD  + dysrhythmias  Rhythm:Regular Rate:Normal     Neuro/Psych  Headaches, PSYCHIATRIC DISORDERS    GI/Hepatic negative GI ROS, Neg liver ROS,   Endo/Other  Hypothyroidism   Renal/GU Renal disease     Musculoskeletal   Abdominal   Peds  Hematology   Anesthesia Other Findings   Reproductive/Obstetrics                           Anesthesia Physical Anesthesia Plan  ASA: 3  Anesthesia Plan: General   Post-op Pain Management:    Induction: Intravenous  PONV Risk Score and Plan: 3 and Ondansetron, Dexamethasone and Midazolam  Airway Management Planned: Oral ETT  Additional Equipment:   Intra-op Plan:   Post-operative Plan: Extubation in OR  Informed Consent: I have reviewed the patients History and Physical, chart, labs and discussed the procedure including the risks, benefits and alternatives for the proposed anesthesia with the patient or authorized representative who has indicated his/her understanding and acceptance.     Dental advisory given  Plan Discussed with: Anesthesiologist and CRNA  Anesthesia Plan Comments: (PAT note written 02/02/2022 by Myra Gianotti, PA-C. )      Anesthesia Quick Evaluation

## 2022-02-09 ENCOUNTER — Ambulatory Visit (HOSPITAL_COMMUNITY): Payer: HMO | Admitting: Vascular Surgery

## 2022-02-09 ENCOUNTER — Encounter (HOSPITAL_COMMUNITY)
Admission: RE | Disposition: A | Discharge status: Home / Self Care | Payer: Self-pay | Source: Home / Self Care | Attending: Surgery

## 2022-02-09 ENCOUNTER — Ambulatory Visit (HOSPITAL_BASED_OUTPATIENT_CLINIC_OR_DEPARTMENT_OTHER): Payer: HMO | Admitting: Anesthesiology

## 2022-02-09 ENCOUNTER — Encounter (HOSPITAL_COMMUNITY): Payer: Self-pay | Admitting: Surgery

## 2022-02-09 ENCOUNTER — Other Ambulatory Visit: Payer: Self-pay

## 2022-02-09 ENCOUNTER — Ambulatory Visit (HOSPITAL_COMMUNITY)
Admission: RE | Admit: 2022-02-09 | Discharge: 2022-02-09 | Disposition: A | Discharge status: Home / Self Care | Payer: HMO | Attending: Surgery | Admitting: Surgery

## 2022-02-09 DIAGNOSIS — K4030 Unilateral inguinal hernia, with obstruction, without gangrene, not specified as recurrent: Secondary | ICD-10-CM

## 2022-02-09 DIAGNOSIS — E039 Hypothyroidism, unspecified: Secondary | ICD-10-CM

## 2022-02-09 DIAGNOSIS — I2510 Atherosclerotic heart disease of native coronary artery without angina pectoris: Secondary | ICD-10-CM | POA: Diagnosis not present

## 2022-02-09 DIAGNOSIS — Z87891 Personal history of nicotine dependence: Secondary | ICD-10-CM | POA: Diagnosis not present

## 2022-02-09 DIAGNOSIS — K4090 Unilateral inguinal hernia, without obstruction or gangrene, not specified as recurrent: Secondary | ICD-10-CM | POA: Diagnosis present

## 2022-02-09 DIAGNOSIS — I10 Essential (primary) hypertension: Secondary | ICD-10-CM | POA: Diagnosis not present

## 2022-02-09 HISTORY — PX: INGUINAL HERNIA REPAIR: SHX194

## 2022-02-09 SURGERY — REPAIR, HERNIA, INGUINAL, ADULT
Anesthesia: General | Site: Groin | Laterality: Left

## 2022-02-09 MED ORDER — DEXAMETHASONE SODIUM PHOSPHATE 10 MG/ML IJ SOLN
INTRAMUSCULAR | Status: DC | PRN
  Administered 2022-02-09: 10 mg via INTRAVENOUS

## 2022-02-09 MED ORDER — ROCURONIUM BROMIDE 10 MG/ML (PF) SYRINGE
PREFILLED_SYRINGE | INTRAVENOUS | Status: DC | PRN
  Administered 2022-02-09: 20 mg via INTRAVENOUS
  Administered 2022-02-09: 50 mg via INTRAVENOUS
  Administered 2022-02-09: 10 mg via INTRAVENOUS

## 2022-02-09 MED ORDER — MIDAZOLAM HCL 2 MG/2ML IJ SOLN
INTRAMUSCULAR | Status: AC
  Filled 2022-02-09: qty 2

## 2022-02-09 MED ORDER — DOCUSATE SODIUM 100 MG PO CAPS: 100.0000 mg | ORAL_CAPSULE | Freq: Two times a day (BID) | ORAL | 2 refills | Status: AC

## 2022-02-09 MED ORDER — CHLORHEXIDINE GLUCONATE CLOTH 2 % EX PADS: 6.0000 | MEDICATED_PAD | Freq: Once | CUTANEOUS | Status: DC

## 2022-02-09 MED ORDER — BUPIVACAINE-EPINEPHRINE 0.25% -1:200000 IJ SOLN
INTRAMUSCULAR | Status: DC | PRN
  Administered 2022-02-09: 30 mL

## 2022-02-09 MED ORDER — 0.9 % SODIUM CHLORIDE (POUR BTL) OPTIME
TOPICAL | Status: DC | PRN
  Administered 2022-02-09: 1000 mL

## 2022-02-09 MED ORDER — ONDANSETRON HCL 4 MG/2ML IJ SOLN
INTRAMUSCULAR | Status: DC | PRN
  Administered 2022-02-09: 4 mg via INTRAVENOUS

## 2022-02-09 MED ORDER — CEFAZOLIN SODIUM-DEXTROSE 2-4 GM/100ML-% IV SOLN
2.0000 g | INTRAVENOUS | Status: AC
  Administered 2022-02-09: 2 g via INTRAVENOUS

## 2022-02-09 MED ORDER — MIDAZOLAM HCL 2 MG/2ML IJ SOLN
INTRAMUSCULAR | Status: DC | PRN
  Administered 2022-02-09: 2 mg via INTRAVENOUS

## 2022-02-09 MED ORDER — HYDROMORPHONE HCL 1 MG/ML IJ SOLN
INTRAMUSCULAR | Status: AC
  Filled 2022-02-09: qty 1

## 2022-02-09 MED ORDER — LACTATED RINGERS IV SOLN: INTRAVENOUS | Status: DC

## 2022-02-09 MED ORDER — PROPOFOL 10 MG/ML IV BOLUS
INTRAVENOUS | Status: DC | PRN
  Administered 2022-02-09: 200 mg via INTRAVENOUS

## 2022-02-09 MED ORDER — OXYCODONE HCL 5 MG PO TABS: 5.0000 mg | ORAL_TABLET | ORAL | 0 refills | Status: AC | PRN

## 2022-02-09 MED ORDER — IBUPROFEN 600 MG PO TABS: 600.0000 mg | ORAL_TABLET | Freq: Four times a day (QID) | ORAL | 1 refills | Status: AC | PRN

## 2022-02-09 MED ORDER — FENTANYL CITRATE (PF) 100 MCG/2ML IJ SOLN: 25.0000 ug | INTRAMUSCULAR | Status: DC | PRN

## 2022-02-09 MED ORDER — CHLORHEXIDINE GLUCONATE 0.12 % MT SOLN
OROMUCOSAL | Status: AC
  Administered 2022-02-09: 15 mL via OROMUCOSAL
  Filled 2022-02-09: qty 15

## 2022-02-09 MED ORDER — CEFAZOLIN SODIUM-DEXTROSE 2-4 GM/100ML-% IV SOLN
INTRAVENOUS | Status: AC
  Filled 2022-02-09: qty 100

## 2022-02-09 MED ORDER — BUPIVACAINE-EPINEPHRINE (PF) 0.25% -1:200000 IJ SOLN
INTRAMUSCULAR | Status: AC
  Filled 2022-02-09: qty 30

## 2022-02-09 MED ORDER — CHLORHEXIDINE GLUCONATE 0.12 % MT SOLN: 15.0000 mL | Freq: Once | OROMUCOSAL | Status: AC

## 2022-02-09 MED ORDER — SUGAMMADEX SODIUM 200 MG/2ML IV SOLN
INTRAVENOUS | Status: DC | PRN
  Administered 2022-02-09: 168.8 mg via INTRAVENOUS

## 2022-02-09 MED ORDER — PHENYLEPHRINE HCL-NACL 20-0.9 MG/250ML-% IV SOLN
INTRAVENOUS | Status: DC | PRN
  Administered 2022-02-09: 40 ug/min via INTRAVENOUS

## 2022-02-09 MED ORDER — METHOCARBAMOL 750 MG PO TABS: 750.0000 mg | ORAL_TABLET | Freq: Four times a day (QID) | ORAL | 1 refills | Status: AC

## 2022-02-09 MED ORDER — FENTANYL CITRATE (PF) 250 MCG/5ML IJ SOLN
INTRAMUSCULAR | Status: AC
  Filled 2022-02-09: qty 5

## 2022-02-09 MED ORDER — LIDOCAINE 2% (20 MG/ML) 5 ML SYRINGE
INTRAMUSCULAR | Status: DC | PRN
  Administered 2022-02-09: 60 mg via INTRAVENOUS

## 2022-02-09 MED ORDER — FENTANYL CITRATE (PF) 250 MCG/5ML IJ SOLN
INTRAMUSCULAR | Status: DC | PRN
  Administered 2022-02-09: 100 ug via INTRAVENOUS
  Administered 2022-02-09: 50 ug via INTRAVENOUS
  Administered 2022-02-09: 100 ug via INTRAVENOUS

## 2022-02-09 MED ORDER — HYDROMORPHONE HCL 1 MG/ML IJ SOLN
0.2500 mg | INTRAMUSCULAR | Status: DC | PRN
  Administered 2022-02-09 (×3): 0.5 mg via INTRAVENOUS

## 2022-02-09 MED ORDER — HEPARIN SODIUM (PORCINE) 5000 UNIT/ML IJ SOLN: 5000.0000 [IU] | Freq: Once | INTRAMUSCULAR | Status: AC

## 2022-02-09 MED ORDER — ORAL CARE MOUTH RINSE: 15.0000 mL | Freq: Once | OROMUCOSAL | Status: AC

## 2022-02-09 MED ORDER — HEPARIN SODIUM (PORCINE) 5000 UNIT/ML IJ SOLN
INTRAMUSCULAR | Status: AC
  Administered 2022-02-09: 5000 [IU] via SUBCUTANEOUS
  Filled 2022-02-09: qty 1

## 2022-02-09 SURGICAL SUPPLY — 38 items
ADH SKN CLS APL DERMABOND .7 (GAUZE/BANDAGES/DRESSINGS) ×1
APL PRP STRL LF DISP 70% ISPRP (MISCELLANEOUS) ×1
BAG COUNTER SPONGE SURGICOUNT (BAG) ×2 IMPLANT
BLADE CLIPPER SURG (BLADE) ×1 IMPLANT
CHLORAPREP W/TINT 26 (MISCELLANEOUS) ×2 IMPLANT
COVER SURGICAL LIGHT HANDLE (MISCELLANEOUS) ×2 IMPLANT
DERMABOND ADVANCED (GAUZE/BANDAGES/DRESSINGS) ×1
DERMABOND ADVANCED .7 DNX12 (GAUZE/BANDAGES/DRESSINGS) ×1 IMPLANT
DRAIN PENROSE 1/2X12 LTX STRL (WOUND CARE) ×1 IMPLANT
DRAPE LAPAROTOMY TRNSV 102X78 (DRAPES) ×1 IMPLANT
ELECT CAUTERY BLADE 6.4 (BLADE) ×1 IMPLANT
ELECT REM PT RETURN 9FT ADLT (ELECTROSURGICAL) ×2
ELECTRODE REM PT RTRN 9FT ADLT (ELECTROSURGICAL) ×1 IMPLANT
GAUZE 4X4 16PLY ~~LOC~~+RFID DBL (SPONGE) ×2 IMPLANT
GLOVE SURG ENC MOIS LTX SZ6.5 (GLOVE) ×2 IMPLANT
GLOVE SURG UNDER POLY LF SZ6 (GLOVE) ×2 IMPLANT
GOWN STRL REUS W/ TWL LRG LVL3 (GOWN DISPOSABLE) ×3 IMPLANT
GOWN STRL REUS W/TWL LRG LVL3 (GOWN DISPOSABLE) ×2
KIT BASIN OR (CUSTOM PROCEDURE TRAY) ×2 IMPLANT
KIT TURNOVER KIT B (KITS) ×2 IMPLANT
MESH ULTRAPRO 3X6 7.6X15CM (Mesh General) ×1 IMPLANT
NDL HYPO 25GX1X1/2 BEV (NEEDLE) ×1 IMPLANT
NEEDLE HYPO 25GX1X1/2 BEV (NEEDLE) ×2 IMPLANT
NS IRRIG 1000ML POUR BTL (IV SOLUTION) ×2 IMPLANT
PACK GENERAL/GYN (CUSTOM PROCEDURE TRAY) ×2 IMPLANT
PAD ARMBOARD 7.5X6 YLW CONV (MISCELLANEOUS) ×2 IMPLANT
SPONGE INTESTINAL PEANUT (DISPOSABLE) ×1 IMPLANT
SPONGE T-LAP 18X18 ~~LOC~~+RFID (SPONGE) ×2 IMPLANT
SUT MNCRL AB 4-0 PS2 18 (SUTURE) ×2 IMPLANT
SUT VIC AB 0 CT2 27 (SUTURE) ×2 IMPLANT
SUT VIC AB 2-0 SH 27 (SUTURE) ×4
SUT VIC AB 2-0 SH 27X BRD (SUTURE) ×1 IMPLANT
SUT VIC AB 2-0 SH 27XBRD (SUTURE) IMPLANT
SUT VIC AB 3-0 SH 27 (SUTURE) ×2
SUT VIC AB 3-0 SH 27XBRD (SUTURE) ×1 IMPLANT
SYR CONTROL 10ML LL (SYRINGE) ×2 IMPLANT
TOWEL GREEN STERILE (TOWEL DISPOSABLE) ×2 IMPLANT
TOWEL GREEN STERILE FF (TOWEL DISPOSABLE) ×2 IMPLANT

## 2022-02-09 NOTE — Discharge Instructions (Addendum)
May shower beginning 02/10/2022. Do not peel off or scrub skin glue. May allow warm soapy water to run over incision, then rinse and pat dry. Do not soak in any water (tubs, hot tubs, pools, lakes, oceans) for one week.  ° °No lifting greater than 5 pounds for six weeks.  ° °Pain regimen: take over-the-counter tylenol (acetaminophen) 1000mg every six hours, the prescription ibuprofen (600mg) every six hours and the robaxin (methocarbamol) 750mg every six hours. With all three of these, you should be taking something every two hours. Example: tylenol ( acetaminophen) at 8am, ibuprofen at 10am, robaxin (methocarbamol) at 12pm, tylenol (acetaminophen) again at 2pm, ibuprofen again at 4pm, robaxin (methocarbamol) at 6pm. You also have a prescription for oxycodone, which should be taken if the tylenol (acetaminophen), ibuprofen, and robaxin (methocarbamol) are not enough to control your pain. You may take the oxycodone as frequently as every four hours as needed, but if you are taking the other medications as above, you should not need the oxycodone this frequently. You have also been given a prescription for colace (docusate) which is a stool softener. Please take this as prescribed because the oxycodone can cause constipation and the colace (docusate) will minimize or prevent constipation. ° °Call the office at [phone] for temperature greater than 101.5F, worsening pain, redness or warmth at the incision site. ° °Please call [phone] to make an appointment for 2-3 weeks after surgery for wound check.  ° ° °

## 2022-02-09 NOTE — Anesthesia Postprocedure Evaluation (Signed)
Anesthesia Post Note  Patient: Joe Walls  Procedure(s) Performed: LEFT INGUINAL HERNIA REPAIR WITH MESH (Left: Groin)     Patient location during evaluation: PACU Anesthesia Type: General Level of consciousness: awake Pain management: pain level controlled Vital Signs Assessment: post-procedure vital signs reviewed and stable Respiratory status: spontaneous breathing Cardiovascular status: stable Postop Assessment: no apparent nausea or vomiting Anesthetic complications: no   No notable events documented.  Last Vitals:  Vitals:   02/09/22 1025 02/09/22 1028  BP:  119/64  Pulse: 65 68  Resp: 17 16  Temp:    SpO2: 94% 92%    Last Pain:  Vitals:   02/09/22 1025  TempSrc:   PainSc: 10-Worst pain ever                 [name]

## 2022-02-09 NOTE — H&P (Signed)
Joe Walls is an 71 y.o. male.   HPI: 1M with symptomatic LIH, plan for [facility] with mesh. The patient has had no hospitalizations, ER visits, surgeries, or newly diagnosed allergies since being seen in the office. Since last office visit, was seen by GI for "stomach problems" and underwent EGD ~1/28 that patient reports was unremarkable. Also saw PCP for scheduled annual physical. Has been holding his DAPT since pre-procedure for EGD.    Past Medical History:  Diagnosis Date   CAD (coronary artery disease)    Carotid artery disease (HCC)    Dysrhythmia    NSVT, s/p "negative" EP study 12/26/20 by Dr. Max Fickle in [facility], loop recorder placed   High cholesterol    Hypertension    Hypothyroidism    Pre-diabetes    Renal disorder    Sleep apnea     Past Surgical History:  Procedure Laterality Date   CARDIAC CATHETERIZATION     CAROTID STENT Right 03/12/2016   by Tollie Pizza, DO in [place]   CORONARY ARTERY BYPASS GRAFT     KNEE SURGERY     LOOP RECORDER IMPLANT  12/26/2020   TRANSCAROTID ARTERY REVASCULARIZATION  Left 07/15/2020   Procedure: LEFT TRANSCAROTID ARTERY REVASCULARIZATION;  Surgeon: Nada Libman, MD;  Location: [facility] OR;  Service: Vascular;  Laterality: Left;   ULTRASOUND GUIDANCE FOR VASCULAR ACCESS Left 07/15/2020   Procedure: ULTRASOUND GUIDANCE FOR VASCULAR ACCESS;  Surgeon: Nada Libman, MD;  Location: [facility] OR;  Service: Vascular;  Laterality: Left;   UPPER EXTREMITY ANGIOGRAM Left 07/15/2020   Procedure: CAROTID ARTERY ANGIOGRAM;  Surgeon: Nada Libman, MD;  Location: [facility] OR;  Service: Vascular;  Laterality: Left;    Family History  Problem Relation Age of Onset   Coronary artery disease Father 3       died of an MI    Social History:  reports that he quit smoking about 29 years ago. His smoking use included cigarettes. He has never used smokeless tobacco. He reports current alcohol use. He reports that he does not use  drugs.  Allergies: No Known Allergies  Medications: I have reviewed the patient's current medications.  No results found for this or any previous visit (from the past 48 hour(s)).  No results found.  ROS 10 point review of systems is negative except as listed above in HPI.   Physical Exam Blood pressure (!) 164/75, pulse 61, temperature 98.2 F (36.8 C), temperature source Oral, resp. rate 17, height 5\' 10"  (1.778 m), weight 84.4 kg, SpO2 99 %. Constitutional: well-developed, well-nourished HEENT: pupils equal, round, reactive to light, 65mm b/l, moist conjunctiva, external inspection of ears and nose normal, hearing intact Oropharynx: normal oropharyngeal mucosa, normal dentition Neck: no thyromegaly, trachea midline, no midline cervical tenderness to palpation Chest: breath sounds equal bilaterally, normal respiratory effort, no midline or lateral chest wall tenderness to palpation/deformity Abdomen: soft, NT, no bruising, no hepatosplenomegaly GU: no blood at urethral meatus of penis, LIH Back: no wounds, no thoracic/lumbar spine tenderness to palpation, no thoracic/lumbar spine stepoffs Rectal: deferred Extremities: 2+ radial and pedal pulses bilaterally, intact motor and sensation bilateral UE and LE, no peripheral edema MSK: unable to assess gait/station, no clubbing/cyanosis of fingers/toes, normal ROM of all four extremities Skin: warm, dry, no rashes Psych: normal memory, normal mood/affect     Assessment/Plan: 1M with LIH, plan for oLIHR. Informed consent was obtained after detailed explanation of risks, including bleeding, infection, hematoma/seroma, decreased fertility, temporary or  permanent neuropathy, hernia recurrence, and mesh infection requiring explantation. All questions answered to the patient's satisfaction. Wife is here, number in chart.   Jesusita Oka, MD General and [place] [facility]

## 2022-02-09 NOTE — Transfer of Care (Signed)
Immediate Anesthesia Transfer of Care Note  Patient: Joe Walls  Procedure(s) Performed: LEFT INGUINAL HERNIA REPAIR WITH MESH (Left: Groin)  Patient Location: PACU  Anesthesia Type:General  Level of Consciousness: awake, alert  and oriented  Airway & Oxygen Therapy: Patient Spontanous Breathing and Patient connected to nasal cannula oxygen  Post-op Assessment: Report given to RN and Post -op Vital signs reviewed and stable  Post vital signs: Reviewed and stable  Last Vitals:  Vitals Value Taken Time  BP    Temp    Pulse 67 02/09/22 0928  Resp    SpO2 95 % 02/09/22 0928  Vitals shown include unvalidated device data.  Last Pain:  Vitals:   02/09/22 0611  TempSrc:   PainSc: 7       Patients Stated Pain Goal: 2 (02/09/22 1027)  Complications: No notable events documented.

## 2022-02-09 NOTE — Op Note (Signed)
° °  Operative Note   Date: 02/09/2022  Procedure: left inguinal hernia repair with mesh  Pre-op diagnosis: left inguinal hernia Post-op diagnosis: incarcerated left indirect and direct hernias  Indication and clinical history: The patient is a 71 y.o. year old male with a left inguinal hernia.  Surgeon: Diamantina Monks, MD  Anesthesiologist: Chilton Si, MD Anesthesia: General  Findings:  Specimen: none EBL: <5cc Drains/Implants: 3x6 ultrapro mesh  Disposition: PACU - hemodynamically stable.  Description of procedure: The patient was positioned supine on the operating room table. General anesthetic induction and intubation were uneventful. Time-out was performed verifying correct patient, procedure, laterality, and signature of informed consent. The left groin was prepped and draped in the usual sterile fashion.  An incision was made approximately 2/3 distance from the ASIS to the pubic tubercle and deepened through Scarpa's fascia until the external oblique aponeurosis was reached. The external oblique aponeurosis was entered sharply and opened using Metzenbaum scissors, so as to avoid injuring the ilioinguinal nerve. The spermatic cord was isolated and encircled with a Penrose drain. A large indirect hernia was clearly identified. During dissection, the hernia sac was entered, but the contents were adhesed to the sac and therefore the sac was closed with 2-0 vicryl suture. The sac and contents were then reduced. A 3x6 ultrapro mesh was cut to fit and sutured to the pubic tubercle with a zero vicryl suture and run along the shelving edge inferiorly and the conjoined tendon superiorly. A slit was made in the center of the mesh to accommodate the cord structures and a vicryl suture was used to create a snug fit around it. The ends of the mesh were overlapped beneath the external oblique aponeurosis. The external oblique was closed with a 2-0 vicryl suture and Scarpa's closed with a 3-0 vicryl.  Local anesthetic was infiltrated into the surrounding tissues. The skin was closed with 4-0 monocryl suture and dermabond applied as dressing.   All sponge and instrument counts were correct at the conclusion of the procedure. The patient was awakened from anesthesia, extubated uneventfully, and transported to PACU - hemodynamically stable.. There were no complications.    Diamantina Monks, MD General and Trauma Surgery [facility] Surgery

## 2022-02-09 NOTE — Anesthesia Procedure Notes (Signed)
Procedure Name: Intubation Date/Time: 02/09/2022 7:37 AM Performed by: Minerva Ends, CRNA Pre-anesthesia Checklist: Patient identified, Emergency Drugs available, Suction available and Patient being monitored Patient Re-evaluated:Patient Re-evaluated prior to induction Oxygen Delivery Method: Circle system utilized Preoxygenation: Pre-oxygenation with 100% oxygen Induction Type: IV induction Ventilation: Mask ventilation without difficulty Laryngoscope Size: Mac and 3 Grade View: Grade II Tube type: Oral Tube size: 7.0 mm Number of attempts: 1 Airway Equipment and Method: Stylet and Oral airway Placement Confirmation: ETT inserted through vocal cords under direct vision, positive ETCO2 and breath sounds checked- equal and bilateral Secured at: 23 cm Tube secured with: Tape Dental Injury: Teeth and Oropharynx as per pre-operative assessment

## 2022-02-12 ENCOUNTER — Encounter (HOSPITAL_COMMUNITY): Payer: Self-pay | Admitting: Surgery

## 2022-06-02 IMAGING — CT CT ANGIO NECK
1 of 11 series · 5 of 33 positions shown · IV contrast (omnipaque)
Comparison: 5367

CLINICAL DATA: Carotid stenosis, follow-up

EXAM:
CT ANGIOGRAPHY HEAD AND NECK
TECHNIQUE: Multidetector CT imaging of the head and neck was performed using
the standard protocol during bolus administration of intravenous
contrast. Multiplanar CT image reconstructions and MIPs were
obtained to evaluate the vascular anatomy. Carotid stenosis
measurements (when applicable) are obtained utilizing NASCET
criteria, using the distal internal carotid diameter as the
denominator.
CONTRAST:  100mL OMNIPAQUE IOHEXOL 350 MG/ML SOLN

[Series 11: axial thin · axial · 0.51mm/px · z∈[+1121,+1365]mm · 5 of 368 slices shown]
[im 62/368  soft-tissue]
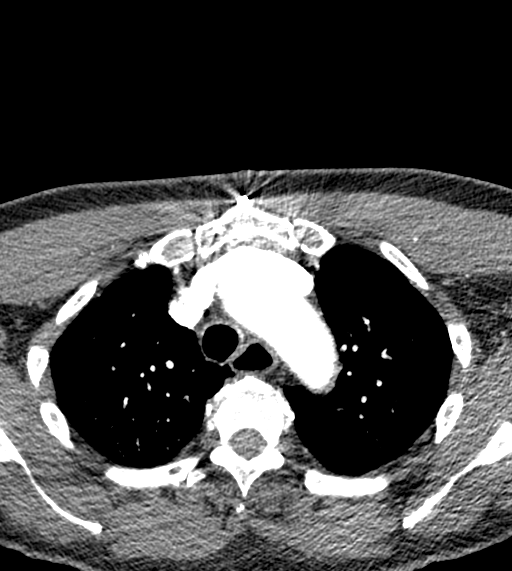
[im 123/368  bone]
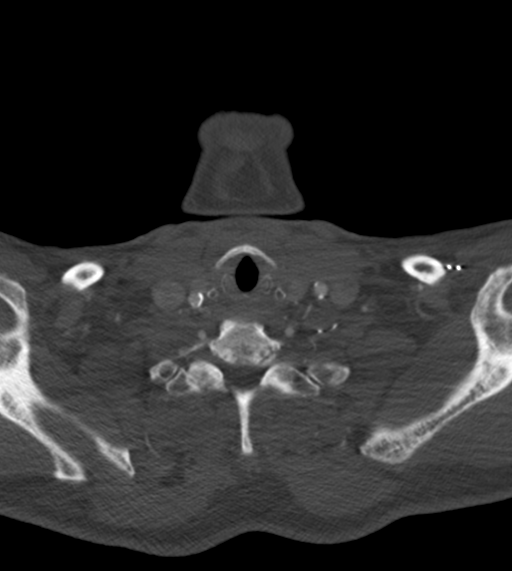
[im 184/368  soft-tissue]
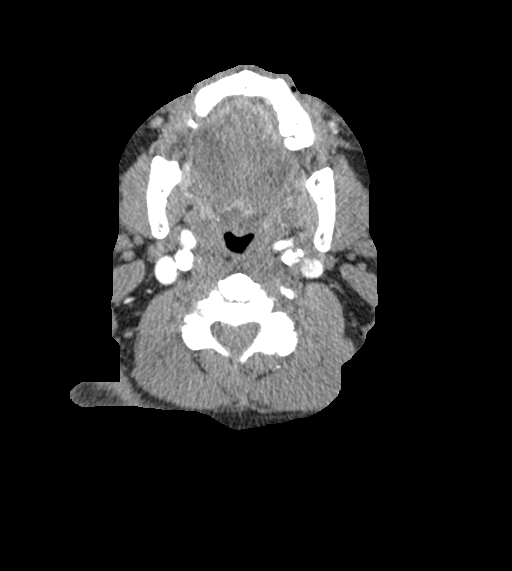
[im 245/368  bone]
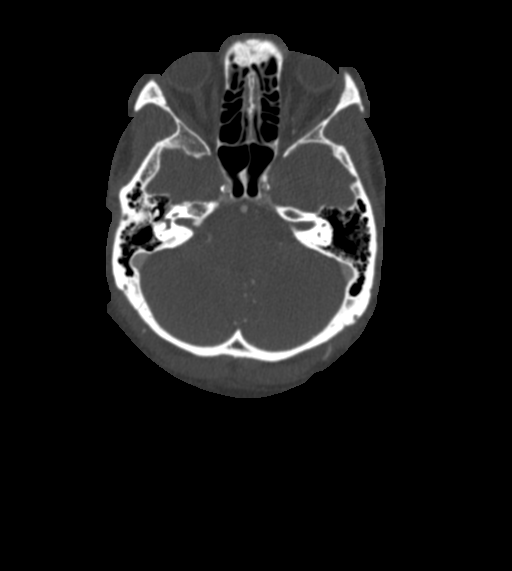
[im 306/368  soft-tissue]
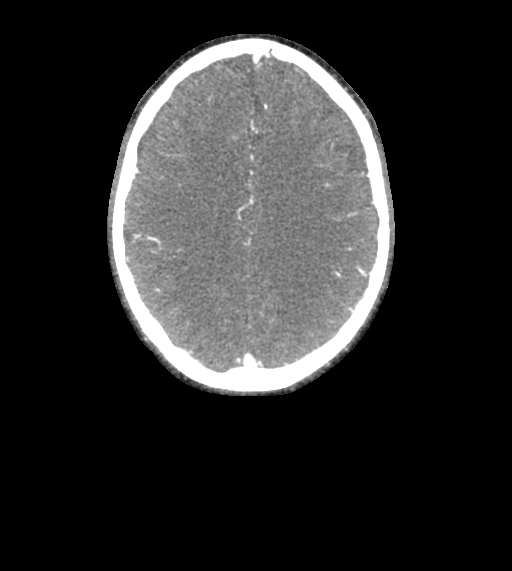

[5 of 33 positions shown; findings below may reference images not displayed]

FINDINGS: CT HEAD

Brain: There is no acute intracranial hemorrhage, mass effect, or
edema. Gray-white differentiation is preserved. There is no
extra-axial fluid collection. Ventricles and sulci are within normal
limits in size and configuration.

Vascular: There is atherosclerotic calcification at the skull base.

Skull: Calvarium is unremarkable.

Sinuses/Orbits: No acute finding.

Other: None.

Review of the MIP images confirms the above findings

CTA NECK

Aortic arch: Mild calcified and noncalcified plaque along the arch
and at the patent great vessel origins.

Right carotid system: Common carotid is patent with calcified plaque
causing less than 50% stenosis. There is primarily calcified plaque
at the ICA origin with interval placement of a stent. There is a
waist proximally with proximal stent lumen measuring 2.4 mm and
distal stent lumen measuring 4.9 mm with resulting stenosis of 50%
by NASCET criteria.

Left carotid system: Common carotid is patent with multifocal
calcified and noncalcified plaque causing less than 50% stenosis.
There is mixed but primarily noncalcified plaque at the ICA origin
with new short segment (less than 1 cm) apparent occlusion and
subsequent reconstitution with diminished caliber of the remainder
of the ICA.

Vertebral arteries: Codominant extracranial vertebral arteries are
patent. There is calcified plaque at the origins with potential
high-grade stenosis.

Skeleton: Advanced multilevel degenerative changes of the cervical
spine as seen previously.

Other neck: No mass or adenopathy.

Upper chest: No apical lung mass.

Review of the MIP images confirms the above findings

CTA HEAD

Anterior circulation: Intracranial internal carotid arteries are
patent with calcified plaque causing at least moderate stenosis,
greater on the left. Anterior and middle cerebral arteries are
patent.

Posterior circulation: Intracranial vertebral arteries patent with
plaque causing mild stenosis. Basilar artery is patent. Posterior
cerebral arteries are patent. A left posterior communicating artery
is identified.

Venous sinuses: Patent as allowed by contrast bolus timing.

Review of the MIP images confirms the above findings
IMPRESSION: No acute intracranial abnormality.

Interval proximal right ICA stent placement. There is approximately
50% in-stent stenosis proximally.

Progression of atherosclerosis at the left ICA origin with new short
segment occlusion and subsequent reconstitution with decreased
caliber relative to prior study.

Similar intracranial ICA atherosclerosis and stenosis.
